# Patient Record
Sex: Female | Born: 1975
Health system: Southern US, Community
[De-identification: ages and names within clinical notes are randomized; demographics above are authoritative.]

## PROBLEM LIST (undated history)

## (undated) DIAGNOSIS — E669 Obesity, unspecified: Secondary | ICD-10-CM

## (undated) DIAGNOSIS — N3010 Interstitial cystitis (chronic) without hematuria: Secondary | ICD-10-CM

## (undated) DIAGNOSIS — K5909 Other constipation: Secondary | ICD-10-CM

## (undated) DIAGNOSIS — G4733 Obstructive sleep apnea (adult) (pediatric): Secondary | ICD-10-CM

## (undated) DIAGNOSIS — D649 Anemia, unspecified: Secondary | ICD-10-CM

## (undated) DIAGNOSIS — L409 Psoriasis, unspecified: Secondary | ICD-10-CM

## (undated) DIAGNOSIS — K449 Diaphragmatic hernia without obstruction or gangrene: Secondary | ICD-10-CM

## (undated) DIAGNOSIS — E039 Hypothyroidism, unspecified: Secondary | ICD-10-CM

## (undated) DIAGNOSIS — F22 Delusional disorders: Secondary | ICD-10-CM

## (undated) DIAGNOSIS — F329 Major depressive disorder, single episode, unspecified: Secondary | ICD-10-CM

## (undated) DIAGNOSIS — Z9989 Dependence on other enabling machines and devices: Secondary | ICD-10-CM

## (undated) DIAGNOSIS — F32A Depression, unspecified: Secondary | ICD-10-CM

## (undated) DIAGNOSIS — N3281 Overactive bladder: Secondary | ICD-10-CM

## (undated) DIAGNOSIS — K589 Irritable bowel syndrome without diarrhea: Secondary | ICD-10-CM

## (undated) DIAGNOSIS — K219 Gastro-esophageal reflux disease without esophagitis: Secondary | ICD-10-CM

## (undated) HISTORY — DX: Gastro-esophageal reflux disease without esophagitis: K21.9

## (undated) HISTORY — DX: Major depressive disorder, single episode, unspecified: F32.9

## (undated) HISTORY — DX: Hypothyroidism, unspecified: E03.9

## (undated) HISTORY — DX: Irritable bowel syndrome, unspecified: K58.9

## (undated) HISTORY — PX: TUBAL LIGATION: SHX77

## (undated) HISTORY — DX: Obstructive sleep apnea (adult) (pediatric): G47.33

## (undated) HISTORY — PX: BLADDER REPAIR: SHX76

## (undated) HISTORY — DX: Delusional disorders: F22

## (undated) HISTORY — DX: Overactive bladder: N32.81

## (undated) HISTORY — DX: Obesity, unspecified: E66.9

## (undated) HISTORY — DX: Interstitial cystitis (chronic) without hematuria: N30.10

## (undated) HISTORY — DX: Other constipation: K59.09

## (undated) HISTORY — DX: Depression, unspecified: F32.A

## (undated) HISTORY — DX: Diaphragmatic hernia without obstruction or gangrene: K44.9

## (undated) HISTORY — DX: Psoriasis, unspecified: L40.9

## (undated) HISTORY — DX: Obstructive sleep apnea (adult) (pediatric): Z99.89

## (undated) HISTORY — DX: Anemia, unspecified: D64.9

---

## 1997-04-23 HISTORY — PX: CHOLECYSTECTOMY: SHX55

## 2003-07-14 ENCOUNTER — Emergency Department (HOSPITAL_COMMUNITY): Admission: EM | Admit: 2003-07-14 | Discharge: 2003-07-14 | Payer: Self-pay | Admitting: Emergency Medicine

## 2004-04-23 HISTORY — PX: ESOPHAGOGASTRODUODENOSCOPY: SHX1529

## 2004-09-08 ENCOUNTER — Emergency Department (HOSPITAL_COMMUNITY): Admission: EM | Admit: 2004-09-08 | Discharge: 2004-09-08 | Payer: Self-pay | Admitting: Emergency Medicine

## 2004-10-20 ENCOUNTER — Ambulatory Visit: Payer: Self-pay | Admitting: Internal Medicine

## 2004-11-09 ENCOUNTER — Ambulatory Visit (HOSPITAL_COMMUNITY): Admission: RE | Admit: 2004-11-09 | Discharge: 2004-11-09 | Payer: Self-pay | Admitting: Internal Medicine

## 2004-11-09 ENCOUNTER — Ambulatory Visit: Payer: Self-pay | Admitting: Internal Medicine

## 2004-11-21 ENCOUNTER — Ambulatory Visit (HOSPITAL_COMMUNITY): Admission: RE | Admit: 2004-11-21 | Discharge: 2004-11-21 | Payer: Self-pay | Admitting: Internal Medicine

## 2004-12-27 ENCOUNTER — Ambulatory Visit: Payer: Self-pay | Admitting: Internal Medicine

## 2005-03-21 ENCOUNTER — Ambulatory Visit: Payer: Self-pay | Admitting: Internal Medicine

## 2005-10-04 ENCOUNTER — Ambulatory Visit: Payer: Self-pay | Admitting: Internal Medicine

## 2005-10-05 ENCOUNTER — Ambulatory Visit (HOSPITAL_COMMUNITY): Admission: RE | Admit: 2005-10-05 | Discharge: 2005-10-05 | Payer: Self-pay | Admitting: Internal Medicine

## 2005-10-17 ENCOUNTER — Ambulatory Visit: Payer: Self-pay | Admitting: Endocrinology

## 2005-11-13 ENCOUNTER — Ambulatory Visit: Payer: Self-pay | Admitting: Internal Medicine

## 2005-11-14 ENCOUNTER — Ambulatory Visit (HOSPITAL_COMMUNITY): Admission: RE | Admit: 2005-11-14 | Discharge: 2005-11-14 | Payer: Self-pay | Admitting: Internal Medicine

## 2007-01-20 ENCOUNTER — Encounter: Payer: Self-pay | Admitting: *Deleted

## 2007-01-20 DIAGNOSIS — K589 Irritable bowel syndrome without diarrhea: Secondary | ICD-10-CM | POA: Insufficient documentation

## 2007-01-20 DIAGNOSIS — F329 Major depressive disorder, single episode, unspecified: Secondary | ICD-10-CM | POA: Insufficient documentation

## 2007-01-20 DIAGNOSIS — J45909 Unspecified asthma, uncomplicated: Secondary | ICD-10-CM | POA: Insufficient documentation

## 2007-01-20 DIAGNOSIS — F22 Delusional disorders: Secondary | ICD-10-CM | POA: Insufficient documentation

## 2007-01-20 DIAGNOSIS — F32A Depression, unspecified: Secondary | ICD-10-CM | POA: Insufficient documentation

## 2007-01-20 DIAGNOSIS — J309 Allergic rhinitis, unspecified: Secondary | ICD-10-CM | POA: Insufficient documentation

## 2007-01-20 DIAGNOSIS — E039 Hypothyroidism, unspecified: Secondary | ICD-10-CM | POA: Insufficient documentation

## 2008-01-26 ENCOUNTER — Ambulatory Visit: Payer: Self-pay | Admitting: Internal Medicine

## 2008-08-23 ENCOUNTER — Encounter: Payer: Self-pay | Admitting: Gastroenterology

## 2008-09-15 DIAGNOSIS — F411 Generalized anxiety disorder: Secondary | ICD-10-CM | POA: Insufficient documentation

## 2008-09-15 DIAGNOSIS — K219 Gastro-esophageal reflux disease without esophagitis: Secondary | ICD-10-CM | POA: Insufficient documentation

## 2008-09-15 DIAGNOSIS — K449 Diaphragmatic hernia without obstruction or gangrene: Secondary | ICD-10-CM | POA: Insufficient documentation

## 2008-09-15 DIAGNOSIS — E7800 Pure hypercholesterolemia, unspecified: Secondary | ICD-10-CM | POA: Insufficient documentation

## 2008-09-15 DIAGNOSIS — K5900 Constipation, unspecified: Secondary | ICD-10-CM | POA: Insufficient documentation

## 2008-09-15 DIAGNOSIS — N318 Other neuromuscular dysfunction of bladder: Secondary | ICD-10-CM | POA: Insufficient documentation

## 2008-09-16 ENCOUNTER — Ambulatory Visit: Payer: Self-pay | Admitting: Internal Medicine

## 2008-09-16 DIAGNOSIS — R110 Nausea: Secondary | ICD-10-CM | POA: Insufficient documentation

## 2008-09-30 ENCOUNTER — Telehealth (INDEPENDENT_AMBULATORY_CARE_PROVIDER_SITE_OTHER): Payer: Self-pay

## 2008-10-11 ENCOUNTER — Encounter: Payer: Self-pay | Admitting: Internal Medicine

## 2009-02-07 ENCOUNTER — Encounter: Payer: Self-pay | Admitting: Urgent Care

## 2009-04-23 HISTORY — PX: COLONOSCOPY: SHX174

## 2009-05-05 ENCOUNTER — Telehealth (INDEPENDENT_AMBULATORY_CARE_PROVIDER_SITE_OTHER): Payer: Self-pay | Admitting: *Deleted

## 2009-05-05 ENCOUNTER — Ambulatory Visit: Payer: Self-pay | Admitting: Internal Medicine

## 2009-05-05 DIAGNOSIS — K625 Hemorrhage of anus and rectum: Secondary | ICD-10-CM | POA: Insufficient documentation

## 2009-05-05 DIAGNOSIS — K648 Other hemorrhoids: Secondary | ICD-10-CM | POA: Insufficient documentation

## 2009-05-19 ENCOUNTER — Encounter: Payer: Self-pay | Admitting: Urgent Care

## 2009-06-13 ENCOUNTER — Encounter (INDEPENDENT_AMBULATORY_CARE_PROVIDER_SITE_OTHER): Payer: Self-pay | Admitting: *Deleted

## 2009-06-14 ENCOUNTER — Telehealth (INDEPENDENT_AMBULATORY_CARE_PROVIDER_SITE_OTHER): Payer: Self-pay | Admitting: *Deleted

## 2009-06-16 ENCOUNTER — Encounter: Payer: Self-pay | Admitting: Internal Medicine

## 2009-07-21 ENCOUNTER — Ambulatory Visit (HOSPITAL_COMMUNITY): Admission: RE | Admit: 2009-07-21 | Discharge: 2009-07-21 | Payer: Self-pay | Admitting: Internal Medicine

## 2009-07-21 ENCOUNTER — Ambulatory Visit: Payer: Self-pay | Admitting: Internal Medicine

## 2009-07-22 ENCOUNTER — Encounter: Payer: Self-pay | Admitting: Urgent Care

## 2009-10-10 ENCOUNTER — Encounter: Payer: Self-pay | Admitting: Urgent Care

## 2009-10-20 ENCOUNTER — Encounter: Payer: Self-pay | Admitting: Gastroenterology

## 2009-12-07 ENCOUNTER — Encounter: Payer: Self-pay | Admitting: Gastroenterology

## 2010-02-13 ENCOUNTER — Encounter: Payer: Self-pay | Admitting: Urgent Care

## 2010-04-18 ENCOUNTER — Encounter: Payer: Self-pay | Admitting: Gastroenterology

## 2010-05-08 ENCOUNTER — Ambulatory Visit
Admission: RE | Admit: 2010-05-08 | Discharge: 2010-05-08 | Payer: Self-pay | Source: Home / Self Care | Attending: Gastroenterology | Admitting: Gastroenterology

## 2010-05-23 NOTE — Medication Information (Signed)
Summary: Tax adviser   Imported By: Diana Eves 05/19/2009 08:43:49  _____________________________________________________________________  External Attachment:    Type:   Image     Comment:   External Document  Appended Document: RX Folder Please call pharmacy.  She was supposed to get 1800 cc, not 180 on 1/13 rx.  Appended Document: RX [facility] and spoke with Dorene Sorrow, pharmacist, and Clydie Braun, tech, in reference to pt's order for  Lactulose on 05/05/2009 was for 1800 cc's not 180 cc's. Pt has been paying a co-pay of $1.10 every time she got 180 cc's. Clydie Braun said they will reimburse pt for $4.40 and give her the full RX. I called pt and informed her, and she said she was wondering why she was having to go so often to the pharmacy.

## 2010-05-23 NOTE — Medication Information (Signed)
Summary: LACTULOSE  LACTULOSE   Imported By: Rexene Alberts 12/07/2009 09:33:55  _____________________________________________________________________  External Attachment:    Type:   Image     Comment:   External Document  Appended Document: LACTULOSE    Prescriptions: LACTULOSE 10 GM/15ML SOLN (LACTULOSE) 15-30cc by mouth two times a day for constipation  #1800cc x 5   Entered and Authorized by:   Leanna Battles. Dixon Boos   Signed by:   Leanna Battles Dixon Boos on 12/07/2009   Method used:   Electronically to        [facility]* (retail)       [address]       [place], [place]  [address]       Ph: [phone]       Fax: [phone]   RxID:   [id number]

## 2010-05-23 NOTE — Medication Information (Signed)
Summary: Tax adviser   Imported By: Diana Eves 07/22/2009 15:19:05  _____________________________________________________________________  External Attachment:    Type:   Image     Comment:   External Document  Appended Document: RX FolderOMEPRAZOLE    Prescriptions: OMEPRAZOLE 40 MG CPDR (OMEPRAZOLE) one by mouth 30 mins before breakfast and at bedtime  #60 x 2   Entered and Authorized by:   Joselyn Arrow FNP-BC   Signed by:   Joselyn Arrow FNP-BC on 07/25/2009   Method used:   Electronically to        [facility] (retail)       [address]       [place], [place]  [address]       Ph: [phone]       Fax: [phone]   RxID:   [id number]

## 2010-05-23 NOTE — Assessment & Plan Note (Signed)
Summary: YR FY/CHRONIC GERD AND NAUSEA,HEMORRHOIDS/SS   Visit Type:  f/u Primary Care Provider:  Selinda Flavin, MD  Chief Complaint:  1 year follow up- having constipation and bleeding and hemorroids.  History of Present Illness: 35 y/o female with chronic constipation, chronic nausea, gerd.  She stopped the glycolax due to bladder irritation.  Currently undergoing bladder stimulation for ?overactive bladder or interstitial cystitis.  Takes docusate sodium 3-4 each night.  BM every 3-4 days and then has multiple stools that day.  Intermittent brbpr and hemorrhoid irritation every two weeks.  No prior TCS.  Wants to have hemorrhoid surgery.  Trying OTC hemorrhoid creams without relief.  Has intermittent nausea with spicey or greasy foods.  Heartburn okay on omeprazole 40mg  by mouth two times a day.  Wt up 15 pounds since 5/09 (25 in last one year).  States she avoids caffeine, eats high fiber diet, excercises 3/week, consumes 8 glasses of fluid daily.    Current Medications (verified): 1)  Lorazepam 0.5 Mg  Tabs (Lorazepam) .... Take 1 Tablet By Mouth Two Times A Day 2)  Vytorin 10-20 Mg  Tabs (Ezetimibe-Simvastatin) .... Take 1po Qd 3)  Levoxyl 75 Mcg Tabs (Levothyroxine Sodium) .... Take 1 Tablet By Mouth Once A Day 4)  Multivitamins   Tabs (Multiple Vitamin) .... Take 1 By Mouth Qd 5)  Promethazine Hcl 25 Mg  Tabs (Promethazine Hcl) .... Take 1 By Mouth Once Daily Prn 6)  Omeprazole 40 Mg Cpdr (Omeprazole) .... One By Mouth 30 Mins Before Breakfast and At Bedtime 7)  Fiber Choice .... As Directed 8)  Risperidone 1 Mg Tabs (Risperidone) .... At Bedtime 9)  Vesicare 10 Mg Tabs (Solifenacin Succinate) .... Once Daily 10)  Albuterol Inhaler .... As Directed 11)  Colace 100 Mg Caps (Docusate Sodium) .... Once Daily 12)  Mometasone Furoate 0.1 % Soln (Mometasone Furoate) .... Once Daily  Allergies (verified): No Known Drug Allergies  Past History:  Past Surgical History: Last updated:  09/15/2008 CHOLECYSTECTOMY  1999  Past Medical History: Allergic rhinitis Asthma Depression Hypothyroidism Psoriasis Overactive bladder/?interstitial cystitis GERD EGD 7/06, patulous EG junction, small hiatal hernia. Normal gastric empyting study, 2006 Normal SBFT, 2007 Paranoia Chronic constipation No prior colonoscopy  Family History: Mother, h/o PUD, treated for breast cancer Father, deceased age 8, DM No FH CRC or liver dz.  Social History: Divorced. 2 children. Unemployed. Never been smoker. No alcohol use.  Review of Systems General:  Denies fever, chills, sweats, anorexia, fatigue, weakness, and weight loss. Eyes:  Denies vision loss. ENT:  Denies nasal congestion and difficulty swallowing. CV:  Denies chest pains, angina, palpitations, syncope, and peripheral edema. Resp:  Denies dyspnea at rest, dyspnea with exercise, and cough. GI:  See HPI. GU:  Complains of urinary frequency; denies urinary burning and blood in urine. MS:  Denies joint pain / LOM. Derm:  Denies rash and itching. Neuro:  Denies weakness, paralysis, and frequent headaches. Psych:  Denies depression, anxiety, and suicidal ideation. Endo:  Denies unusual weight change. Heme:  Denies bruising and bleeding. Allergy:  Denies hives and rash.  Vital Signs:  Patient profile:   35 year old female Height:      66 inches Weight:      280 pounds BMI:     45.36 Temp:     98.2 degrees F oral Pulse rate:   60 / minute BP sitting:   110 / 72  (left arm) Cuff size:   large  Vitals Entered By: Hendricks Limes  LPN (May 05, 2009 11:33 AM)  Physical Exam  General:  Well developed, well nourished, no acute distress.obese.   Head:  Normocephalic and atraumatic. Eyes:  Conjunctivae pink, no scleral icterus.  Mouth:  Oropharyngeal mucosa moist, pink.  No lesions, erythema or exudate.    Neck:  Supple; no masses or thyromegaly. Lungs:  Clear throughout to auscultation. Heart:  Regular rate and rhythm;  no murmurs, rubs,  or bruits. Abdomen:  normal bowel sounds, obese, and suprapubic tenderness  normal bowel sounds, obese, suprapubic tendernessRUQ tenderness, without guarding, without rebound, no hernia, no masses, and no hepatomegally or splenomegaly.   Rectal:  No ext hemorrhoids.  Soft brown stool, heme negative. No masses in rectal vault.  perirectal tenderness.   Extremities:  No clubbing, cyanosis, edema or deformities noted. Neurologic:  Alert and  oriented x4;  grossly normal neurologically. Skin:  Intact without significant lesions or rashes. Cervical Nodes:  No significant cervical adenopathy. Psych:  depressed affect.    Impression & Recommendations:  Problem # 1:  CONSTIPATION, CHRONIC (ICD-564.09)  Chronic constipation with intermittent anorectal irritation and brbpr.  Suspect internal hemorrhoids versus anorectal fissure.  Needs colonoscopy to r/o polyps, malignancy, etc.  Patient wants to schedule after she completes bladder stimulation procedures.  She will call to schedule when she is ready.  She c/o inadequate sedation at time of EGD in 2006.  Due to this and polypharmacy, will arrange for sedation via MAC. In meantime, she will stop docusate sodium.  Add Lactulose, probiotic.  2 week course of hemorrhoidal cream.  High fiber diet and increase excercise to 5/week.   Orders: Est. Patient Level III ([address])  Problem # 2:  GERD (ICD-530.81)  Stable.  Continue omeprazole 40mg  by mouth two times a day for now. Encouraged weight loss and antireflux measures.  Orders: Est. Patient Level III (47425)  Patient Instructions: 1)  Stop Colace. 2)  Start lactulose, see RX. 3)  Start Dig Advantage, constipation formula, one daily for four weeks. 4)  Call to schedule colonoscopy as soon as you are ready. 5)  High Fiber, Low Fat  Healthy Eating Plan brochure given.  6)  The medication list was reviewed and reconciled.  All changed / newly prescribed medications were explained.  A  complete medication list was provided to the patient / caregiver. Prescriptions: HYDROCORTISONE 2.5 % CREA (HYDROCORTISONE) apply anorectally two times a day for two weeks  #2 weeks x 0   Entered and Authorized by:   Leanna Battles. Dixon Boos   Signed by:   Leanna Battles Dixon Boos on 05/05/2009   Method used:   Electronically to        [facility] (retail)       [address]       [place], [place]  [address]       Ph: [phone]       Fax: [phone]   RxID:   [id number] OMEPRAZOLE 40 MG CPDR (OMEPRAZOLE) one by mouth 30 mins before breakfast and at bedtime  #60 x 5   Entered and Authorized by:   Leanna Battles. Dixon Boos   Signed by:   Leanna Battles Dixon Boos on 05/05/2009   Method used:   Electronically to        [facility] (retail)       [address]       [place], [place]  [address]  Ph: [phone]       Fax: [phone]   RxID:   [id number] PROMETHAZINE HCL 25 MG  TABS (PROMETHAZINE HCL) take 1 by mouth once daily prn  #20 x 0   Entered and Authorized by:   Leanna Battles. Dixon Boos   Signed by:   Leanna Battles Dixon Boos on 05/05/2009   Method used:   Electronically to        [facility] (retail)       [address]       [place], [place]  [address]       Ph: [phone]       Fax: [phone]   RxID:   [id number] LACTULOSE 10 GM/15ML SOLN (LACTULOSE) 15-30cc by mouth two times a day for constipation  #1800cc x 3   Entered and Authorized by:   Leanna Battles. Dixon Boos   Signed by:   Leanna Battles Dionte Blaustein PA-C on 05/05/2009   Method used:   Print then Give to Patient   RxID:   [id number]   Appended Document: Orders Updatehemocult    Clinical Lists Changes  Orders: Added new Service order of Hemoccult Guaiac-1 spec.(in office) [phone]) - Signed

## 2010-05-23 NOTE — Medication Information (Signed)
Summary: PROMETHAZINE 25MG   PROMETHAZINE 25MG    Imported By: Rexene Alberts 02/13/2010 08:15:39  _____________________________________________________________________  External Attachment:    Type:   Image     Comment:   External Document  Appended Document: PROMETHAZINE 25MG  Denied. Pt needs OV to discuss.  Appended Document: PROMETHAZINE 25MG  pharmacy informed

## 2010-05-23 NOTE — Medication Information (Signed)
Summary: Tax adviser   Imported By: Diana Eves 10/10/2009 09:20:05  _____________________________________________________________________  External Attachment:    Type:   Image     Comment:   External Document  Appended Document: RX FolderPRILOSEC    Prescriptions: OMEPRAZOLE 40 MG CPDR (OMEPRAZOLE) one by mouth 30 mins before breakfast and at bedtime  #60 x 5   Entered and Authorized by:   Joselyn Arrow FNP-BC   Signed by:   Joselyn Arrow FNP-BC on 10/10/2009   Method used:   Electronically to        [facility] (retail)       [address]       [place], [place]  [address]       Ph: [phone]       Fax: [phone]   RxID:   [id number]

## 2010-05-23 NOTE — Letter (Signed)
Summary: Recall Radiology  [facility]  [address]   [place], [place] [address]   Phone: [phone]  Fax: [phone]    June 13, 2009  LINN CLAVIN [address] [place], [place]  [address] [birth year]   Dear Ms. MULLIGAN,   Our office needs to get you scheduled for your Colonoscopy. Please give our office a call to schedule this.  You may call the office at your convenience at [phone].  Please ask for the Referral Coordinator to make arrangements for this to be scheduled.  You may have to leave a message on our voice mail.  We will return your call.  If for any reason you do not wish to schedule this, please advise the office.  Please do not neglect your health.   Thank you,    Ave Filter  [facility] Ph: [phone]   Fax: [phone]

## 2010-05-23 NOTE — Progress Notes (Signed)
Summary: Shirley Perry  Phone Note Call from Patient   Summary of Call: Pt had called and LMOM. She was calling regarding the letter she received. Her number is [phone] Initial call taken by: Diana Eves,  June 14, 2009 3:49 PM     Appended Document: Shirley Perry I spoke with pt TCS scheduled for 07/21/09@7 :30am

## 2010-05-23 NOTE — Medication Information (Signed)
Summary: Tax adviser   Imported By: Diana Eves 05/19/2009 16:25:18  _____________________________________________________________________  External Attachment:    Type:   Image     Comment:   External Document  Appended Document: RX Folder Already addressed.  See previous note.

## 2010-05-23 NOTE — Medication Information (Signed)
Summary: RX Folder  RX Folder   Imported By: Peggyann Shoals 10/20/2009 14:15:38  _____________________________________________________________________  External Attachment:    Type:   Image     Comment:   External Document  Appended Document: RX Folder-lactulose    Prescriptions: LACTULOSE 10 GM/15ML SOLN (LACTULOSE) 15-30cc by mouth two times a day for constipation  #1800cc x 5   Entered and Authorized by:   Leanna Battles. Dixon Boos   Signed by:   Leanna Battles Dixon Boos on 10/20/2009   Method used:   Electronically to        [facility] (retail)       [address]       [place], [place]  [address]       Ph: [phone]       Fax: [phone]   RxID:   [id number]

## 2010-05-23 NOTE — Progress Notes (Signed)
Summary: TCS in the OR  ---- Converted from flag ---- ---- 05/05/2009 1:49 PM, Shirley Perry. Shirley Perry wrote: Please note, when patient calls to schedule TCS, it needs to be done in OR.  Also, if outside of 30 days from now but less than 90, we can triage her. ------------------------------

## 2010-05-23 NOTE — Letter (Signed)
Summary: TRIAGE ORDER  TRIAGE ORDER   Imported By: Ave Filter 06/16/2009 13:57:00  _____________________________________________________________________  External Attachment:    Type:   Image     Comment:   External Document

## 2010-05-25 NOTE — Medication Information (Signed)
Summary: OMEPRAZOLE 40MG   OMEPRAZOLE 40MG    Imported By: Rexene Alberts 04/18/2010 09:53:33  _____________________________________________________________________  External Attachment:    Type:   Image     Comment:   External Document  Appended Document: OMEPRAZOLE 40MG     Prescriptions: OMEPRAZOLE 40 MG CPDR (OMEPRAZOLE) one by mouth 30 mins before breakfast and at bedtime  #60 x 5   Entered and Authorized by:   Gerrit Halls NP   Signed by:   Gerrit Halls NP on 04/18/2010   Method used:   Faxed to ...       [facility]* (retail)       [address]       [place], [place]  [address]       Ph: [phone]       Fax: [phone]   RxID:   [id number]

## 2010-05-25 NOTE — Assessment & Plan Note (Signed)
Summary: PT NEEDS OV PRIOR TO REFILLS ON PHENEGRAN/CM   Visit Type:  Follow-up Visit Primary Care Provider:  Selinda Flavin, MD  CC:  OV for refills on phenergan.  History of Present Illness: [name] is a 35 year old female with a hx of chronic nausea, GERD, constipation. She reports today doing fairly well. c/o intermittent nausea, on average 3-6X per month. States "lasts all day". Feels may be attributed to fried and spicy foods. Stress worsens nausea.  Denies abdominal pain. Denies constipation. Has 2-3 soft BMs daily, no blood noted. on omeprazole twice/day, which seems to be the best PPI for her. She has tried multiple agents in the past. Wt remains at 41, which is stable from last January.  Wt 2007: 248 Wt 2009: 255 Wt 2010: 266 Wt 2011: 280  TCS for constipation: 07/21/09 IMPRESSION:  Single anal papilla, otherwise normal rectum, colon, terminal ileum.  Current Medications (verified): 1)  Vytorin 10-20 Mg  Tabs (Ezetimibe-Simvastatin) .... Take 1po Qd 2)  Levoxyl 75 Mcg Tabs (Levothyroxine Sodium) .... Take 1 Tablet By Mouth Once A Day 3)  Multivitamins   Tabs (Multiple Vitamin) .... Take 1 By Mouth Qd 4)  Promethazine Hcl 25 Mg  Tabs (Promethazine Hcl) .... Take 1 By Mouth Once Daily Prn 5)  Omeprazole 40 Mg Cpdr (Omeprazole) .... One By Mouth 30 Mins Before Breakfast and At Bedtime 6)  Vesicare 10 Mg Tabs (Solifenacin Succinate) .... Once Daily 7)  Albuterol Inhaler .... As Directed 8)  Mometasone Furoate 0.1 % Soln (Mometasone Furoate) .... Once Daily 9)  Lactulose 10 Gm/5ml Soln (Lactulose) .... 30cc Once Daily and As Needed 10)  Lorazepam 1 Mg Tabs (Lorazepam) .... One Tablet Three Tmes Daily As Needed 11)  Elmiron 100 Mg Caps (Pentosan Polysulfate Sodium) .... One Tablet Three Times Daily 12)  Latuda 40 Mg Tabs (Lurasidone Hcl) 13)  Latuda 40 Mg Tabs (Lurasidone Hcl) .... One Tablet Daily 14)  Vitamin D .... One Tablet Daily 15)  Childrens Multi-Vitamin .... One  Tablet Daily  Allergies (verified): No Known Drug Allergies  Past History:  Past Surgical History: Last updated: 09/15/2008 CHOLECYSTECTOMY  1999  Past Medical History: Allergic rhinitis Asthma Depression Hypothyroidism Psoriasis Overactive bladder/?interstitial cystitis GERD EGD 7/06, patulous EG junction, small hiatal hernia. Normal gastric empyting study, 2006 Normal SBFT, 2007 Paranoia Chronic constipation  07/21/09 TCS: IMPRESSION:  Single anal papilla, otherwise normal rectum, colon, terminal ileum.   Family History: Reviewed history from 05/05/2009 and no changes required. Mother, h/o PUD, treated for breast cancer Father, deceased age 84, DM No FH CRC or liver dz.  Social History: Reviewed history from 05/05/2009 and no changes required. Divorced. 2 children 17 and 12. Unemployed. Never been smoker. No alcohol use.   Review of Systems General:  Denies fever, chills, and anorexia. Eyes:  Denies blurring, irritation, and discharge. ENT:  Denies sore throat, hoarseness, and difficulty swallowing. CV:  Denies chest pains and syncope. Resp:  Denies dyspnea at rest and wheezing. GI:  Complains of nausea; denies indigestion/heartburn, abdominal pain, diarrhea, constipation, change in bowel habits, bloody BM's, and black BMs. GU:  Denies urinary burning and urinary frequency. MS:  Denies joint pain / LOM, joint swelling, and joint stiffness. Derm:  Denies rash, itching, and dry skin. Neuro:  Denies weakness and syncope. Psych:  Denies depression and anxiety. Endo:  Denies cold intolerance and heat intolerance. Heme:  Denies bruising and bleeding.  Vital Signs:  Patient profile:   35 year old female Height:  66 inches Weight:      280.50 pounds BMI:     45.44 Temp:     98.3 degrees F oral Pulse rate:   80 / minute BP sitting:   118 / 72  (left arm) Cuff size:   large  Vitals Entered By: Cloria Spring LPN (May 08, 2010 2:04 PM)  Physical  Exam  General:  Well developed, well nourished, no acute distress.obese.   Head:  Normocephalic and atraumatic. Eyes:  sclera without icterus Mouth:  No deformity or lesions, dentition normal. Lungs:  Clear throughout to auscultation. Heart:  Regular rate and rhythm; no murmurs, rubs,  or bruits. Abdomen:  obese, soft, +BS, non-tender, non-distended. No HSM, no rebound or guarding noted.  Msk:  Symmetrical with no gross deformities. Normal posture. Pulses:  Normal pulses noted. Extremities:  No clubbing, cyanosis, edema or deformities noted. Neurologic:  Alert and  oriented x4;  grossly normal neurologically. Skin:  Intact without significant lesions or rashes. Psych:  Alert and cooperative. Normal mood and affect.  Impression & Recommendations:  Problem # 1:  NAUSEA (ICD-43.27)  35 year old Caucasian female with chronic nausea, has undergone thorough work-up and evaluation in the past. Actually seems to be doing fairly well at this time with only 3-6 episodes of nausea per month, which seems to be linked to fried and fatty foods. GERD well controlled currently on twice daily prilosec. . Stress definitely exacerbated her nausea. She does remain at a stable weight from last January, but she has steadily gained weight over the past few years we have been seeing her. Discussed in detail importance of diet and exercise for overall health as well as reflux symptoms.   Continue Prilosec twice per day Phenergan as needed, hopefully this can be weaned off by the next follow-up as pt avoids triggers such as foods/anxiety, etc weight loss goal of 10lbs by next visit follow-up in 6 mos  Orders: Est. Patient Level II ([address])  Problem # 2:  CONSTIPATION, CHRONIC (ICD-564.09)  hx of chronic constipation, denies constipation currently. Lactulose working well. No melena, hematochezia, continue current regimen.   Orders: Est. Patient Level II ([address]) Prescriptions: LACTULOSE 10 GM/15ML SOLN  (LACTULOSE) take 30 ml by mouth daily for constipation  #1 month x 5   Entered and Authorized by:   Gerrit Halls NP   Signed by:   Gerrit Halls NP on 05/08/2010   Method used:   Faxed to ...       [facility] (retail)       [address]       [place], [place]  [address]       Ph: [phone]       Fax: [phone]   RxID:   [id number] PROMETHAZINE HCL 25 MG TABS (PROMETHAZINE HCL) take 1 by mouth daily as needed nausea  #30 x 1   Entered and Authorized by:   Gerrit Halls NP   Signed by:   Gerrit Halls NP on 05/08/2010   Method used:   Faxed to ...       [facility] (retail)       [address]       [place], [place]  [address]       Ph: [phone]       Fax: [phone]   RxID:   [id number]   Appended Document: PT NEEDS OV PRIOR TO REFILLS ON PHENEGRAN/CM 6  MONTH F/U OPV IS IN THE COMPUTER

## 2010-06-27 ENCOUNTER — Encounter: Payer: Self-pay | Admitting: Gastroenterology

## 2010-07-05 ENCOUNTER — Encounter: Payer: Self-pay | Admitting: Internal Medicine

## 2010-07-05 ENCOUNTER — Encounter: Payer: Self-pay | Admitting: Gastroenterology

## 2010-07-05 ENCOUNTER — Other Ambulatory Visit: Payer: Self-pay | Admitting: Internal Medicine

## 2010-07-05 ENCOUNTER — Ambulatory Visit (INDEPENDENT_AMBULATORY_CARE_PROVIDER_SITE_OTHER): Payer: Medicare Other | Admitting: Gastroenterology

## 2010-07-05 DIAGNOSIS — R110 Nausea: Secondary | ICD-10-CM

## 2010-07-11 NOTE — Letter (Signed)
Summary: GASTRIC EMPTYING STUDY ORDER  GASTRIC EMPTYING STUDY ORDER   Imported By: Ave Filter 07/05/2010 15:45:42  _____________________________________________________________________  External Attachment:    Type:   Image     Comment:   External Document

## 2010-07-11 NOTE — Assessment & Plan Note (Signed)
Summary: fu ov in 6 months, acid reflux/pt not due until July but want...   Vital Signs:  Patient profile:   35 year old female Height:      65 inches Weight:      287 pounds BMI:     47.93 Temp:     98.9 degrees F oral Pulse rate:   92 / minute BP sitting:   120 / 72  (left arm)  Vitals Entered By: Carolan Clines LPN (July 05, 2010 2:39 PM)  Visit Type:  Follow-up Visit Primary Care Provider:  Selinda Flavin, MD   History of Present Illness: [name] presents today in f/u. Chronic nausea. Last GES in 2006. Denies abdominal pain. Continues to gain weight. Up actually 7 lbs from visit in Jan. She has continued to steadily gain weight over the past few years. Feels like reflux is controlled fairly well on two times a day prilosec. She has tried others in past, but this seems to work best for her. She is on several medications for management of bipolar disorder, with side effects of possible nausea. Difficult to tell if this is what is causing this chronic nausea. She will be seeing Dr. Raliegh Scarlet next month. We will try to get a sooner appt for her.  Denies dysphagia/odynophagia. No difficulty with constipation currently.   Current Medications (verified): 1)  Vytorin 10-20 Mg  Tabs (Ezetimibe-Simvastatin) .... Take 1po Qd 2)  Levoxyl 75 Mcg Tabs (Levothyroxine Sodium) .... Take 1 Tablet By Mouth Once A Day 3)  Multivitamins   Tabs (Multiple Vitamin) .... Take 1 By Mouth Qd 4)  Promethazine Hcl 25 Mg  Tabs (Promethazine Hcl) .... Take 1 By Mouth Once Daily Prn 5)  Omeprazole 40 Mg Cpdr (Omeprazole) .... One By Mouth 30 Mins Before Breakfast and At Bedtime 6)  Vesicare 10 Mg Tabs (Solifenacin Succinate) .... Once Daily 7)  Albuterol Inhaler .... As Directed 8)  Mometasone Furoate 0.1 % Soln (Mometasone Furoate) .... Once Daily 9)  Lactulose 10 Gm/48ml Soln (Lactulose) .... 30cc Once Daily and As Needed 10)  Lorazepam 1 Mg Tabs (Lorazepam) .... One Tablet Three Tmes Daily As Needed 11)   Elmiron 100 Mg Caps (Pentosan Polysulfate Sodium) .... One Tablet Three Times Daily 12)  Latuda 40 Mg Tabs (Lurasidone Hcl) .... Take One Once Daily  Allergies (verified): No Known Drug Allergies  Past History:  Past Medical History: Last updated: 05/08/2010 Allergic rhinitis Asthma Depression Hypothyroidism Psoriasis Overactive bladder/?interstitial cystitis GERD EGD 7/06, patulous EG junction, small hiatal hernia. Normal gastric empyting study, 2006 Normal SBFT, 2007 Paranoia Chronic constipation  07/21/09 TCS: IMPRESSION:  Single anal papilla, otherwise normal rectum, colon, terminal ileum.  Review of Systems General:  Denies fever, chills, and anorexia. Eyes:  Denies blurring, irritation, and discharge. ENT:  Denies sore throat, hoarseness, and difficulty swallowing. CV:  Denies chest pains and syncope. Resp:  Denies dyspnea at rest and wheezing. GI:  See HPI. GU:  Denies urinary burning and urinary frequency. MS:  Denies joint pain / LOM, joint swelling, and joint stiffness. Derm:  Denies rash, itching, and dry skin. Neuro:  Denies weakness and syncope. Psych:  Denies depression and anxiety. Endo:  Denies cold intolerance and heat intolerance.  Physical Exam  General:  Well developed, well nourished, no acute distress.obese.   Lungs:  Clear throughout to auscultation. Heart:  Regular rate and rhythm; no murmurs, rubs,  or bruits. Abdomen:  obese, +BS, soft, non-tender, non-distended. no HSM noted. no rebound or guarding.  Msk:  Symmetrical with no gross deformities. Normal posture. Neurologic:  Alert and  oriented x4;  grossly normal neurologically. Skin:  Intact without significant lesions or rashes. Psych:  Alert and cooperative. Normal mood and affect.   Impression & Recommendations:  Problem # 1:  NAUSEA (ICD-31.51)  35 year old Caucasian female with hx significant for bipolar disorder, chronic nausea. She has been taking phenergan in past and is now  requesting something that has less side effects of drowsiness. It should be noted, that although she continues to complain of mainly morning nausea, she has steadily gained weight over the past few years; she has gained an additional 7 lbs since Jan 2012. Last GES was done in 2006. Although it is a low likelihood of gastroparesis, we should revisit this and possibly proceed with an upper endoscopy to assess for gastritis or other factors. Pt may very well have nausea r/t medication regimen for bipolar disorder. We will attempt to have her see Dr. Raliegh Scarlet sooner.  GES Continue prilosec twice/day Sooner appt with Dr. Raliegh Scarlet for medication adjustement (possibly) Consider EGD if nausea continues Zofran instead of phenergan WEIGHT LOSS. Discussed avoidance of fatty/fried foods, exercise. F/U in 6 weeks.   Orders: Est. Patient Level II (29562) Prescriptions: LACTULOSE 10 GM/15ML SOLN (LACTULOSE) 30cc once daily and as needed  #964ml x 3   Entered and Authorized by:   Gerrit Halls NP   Signed by:   Gerrit Halls NP on 07/05/2010   Method used:   Faxed to ...       [facility]* (retail)       [address]       [place], [place]  [address]       Ph: [phone]       Fax: [phone]   RxID:   [id number] ZOFRAN 4 MG TABS (ONDANSETRON HCL) take 1 by mouth every 6 hours as needed nausea  #120 x 1   Entered and Authorized by:   Gerrit Halls NP   Signed by:   Gerrit Halls NP on 07/05/2010   Method used:   Faxed to ...       [facility]* (retail)       [address]       [place], [place]  [address]       Ph: [phone]       Fax: [phone]   RxID:   [id number]    Orders Added: 1)  Est. Patient Level II [02542]  Appended Document: fu ov in 6 months, acid reflux/pt not due until July but want... 6 WK F/U OPV IS IN THE COMPUTER

## 2010-07-14 ENCOUNTER — Encounter (HOSPITAL_COMMUNITY): Payer: Medicare Other

## 2010-07-16 LAB — BASIC METABOLIC PANEL
BUN: 8 mg/dL (ref 6–23)
Calcium: 9.3 mg/dL (ref 8.4–10.5)
Chloride: 102 mEq/L (ref 96–112)
Glucose, Bld: 89 mg/dL (ref 70–99)
Potassium: 4.2 mEq/L (ref 3.5–5.1)
Sodium: 136 mEq/L (ref 135–145)

## 2010-07-17 ENCOUNTER — Encounter: Payer: Self-pay | Admitting: Gastroenterology

## 2010-08-04 ENCOUNTER — Encounter (HOSPITAL_COMMUNITY)
Admission: RE | Admit: 2010-08-04 | Discharge: 2010-08-04 | Disposition: A | Discharge status: Home / Self Care | Payer: Medicare Other | Source: Ambulatory Visit | Attending: Internal Medicine | Admitting: Internal Medicine

## 2010-08-04 DIAGNOSIS — R110 Nausea: Secondary | ICD-10-CM | POA: Insufficient documentation

## 2010-08-04 MED ORDER — TECHNETIUM TC 99M SULFUR COLLOID
2.0000 | Freq: Once | INTRAVENOUS | Status: AC | PRN
  Administered 2010-08-04: 2.2 via INTRAVENOUS

## 2010-08-11 ENCOUNTER — Encounter: Payer: Self-pay | Admitting: Gastroenterology

## 2010-08-11 ENCOUNTER — Ambulatory Visit (INDEPENDENT_AMBULATORY_CARE_PROVIDER_SITE_OTHER): Payer: Medicare Other | Admitting: Gastroenterology

## 2010-08-11 VITALS — BP 117/69 | HR 84 | Temp 98.5°F | Ht 66.0 in | Wt 267.2 lb

## 2010-08-11 DIAGNOSIS — K5909 Other constipation: Secondary | ICD-10-CM

## 2010-08-11 DIAGNOSIS — K219 Gastro-esophageal reflux disease without esophagitis: Secondary | ICD-10-CM

## 2010-08-11 DIAGNOSIS — R110 Nausea: Secondary | ICD-10-CM

## 2010-08-11 DIAGNOSIS — R634 Abnormal weight loss: Secondary | ICD-10-CM

## 2010-08-11 NOTE — Assessment & Plan Note (Signed)
Recent intentional weight loss. Congratulated her on her efforts. Reports up to date on TSH.

## 2010-08-11 NOTE — Assessment & Plan Note (Signed)
Chronic constipation. She may require multiple laxatives to get good results. Currently on moderate dose of lactulose. May increase to 3 times a day for a few days but if she requires persistent increase in dose then would advise her to add back MiraLax along with her lactulose. We did discuss a trial of Amitiza but given her chronic nausea will avoid it for now.

## 2010-08-11 NOTE — Assessment & Plan Note (Addendum)
Chronic nausea without vomiting. GERD well controlled. Continues to have intermittent constipation. She's not feel her symptoms are related to the lactulose. As previously discussed, arrange for EGD for further evaluation of chronic nausea/GERD. EGD to be done with deep sedation given her history of polypharmacy and inadequate conscious sedation. I have discussed the risks, alternatives, benefits with regards to but not limited to the risk of reaction to medication, bleeding, infection, perforation and the patient is agreeable to proceed. Written consent to be obtained.  Would minimize Zofran and Phenergan as much as possible.  We did discuss that if her EGD is unremarkable, we may need to consider looking for non-GI related source of her nausea such as head CT, medication side effect, depression as a cause. Of note she has never had CT imaging of her abdomen

## 2010-08-11 NOTE — Progress Notes (Signed)
Cc to PCP 

## 2010-08-11 NOTE — Progress Notes (Signed)
Primary Care Physician:  Criss Rosales, MD  Primary Gastroenterologist:  Dr. Roetta Sessions  Chief Complaint  Patient presents with  . Constipation  . Nausea    HPI:  Shirley Perry is a 35 y.o. female here for six-week followup for persistent nausea. Since her last office visit she had a gastric emptying study which was normal. Her nausea is been going on for a long time. Up until recently she has had a steady weight gain over last couple years. I believe her last weight was inaccurate at 287 pounds because today she weighs 267 pounds. She states she has not lost 20 pounds in the last 6 weeks but has lost about 10 pounds on a new low-carb diet. She wakes up the morning with nausea. She has no vomiting. States her reflux is well-controlled. Denies dysphagia. No significant abdominal pain. Bowel movements continue to be difficult for her. She has chronic constipation. Currently on lactulose as she states MiraLax stopped working. She's taking 2 tablespoons of MiraLax twice a day, some days has good results but other days none. Recently increased constipation due to Zofran. No recent rectal bleeding. No melena. No recent change in medications.  Current Outpatient Prescriptions  Medication Sig Dispense Refill  . albuterol (PROVENTIL,VENTOLIN) 90 MCG/ACT inhaler Inhale 2 puffs into the lungs every 6 (six) hours as needed.        . Cinnamon 500 MG capsule Take 2,000 mg by mouth daily.        Marland Kitchen ezetimibe-simvastatin (VYTORIN) 10-20 MG per tablet Take 1 tablet by mouth daily.        . Lactobacillus (ACIDOPHILUS) 100 MG CAPS Take 1 capsule by mouth as needed.        . lactulose (CHRONULAC) 10 GM/15ML solution Take 20 g by mouth daily as needed.        Marland Kitchen levothyroxine (SYNTHROID, LEVOTHROID) 75 MCG tablet Take 75 mcg by mouth daily.        Marland Kitchen LORazepam (ATIVAN) 1 MG tablet Take 1 mg by mouth 3 (three) times daily as needed.        . Lurasidone HCl (LATUDA) 40 MG TABS Take by mouth daily.        .  mometasone (ELOCON) 0.1 % cream Apply 1 application topically daily.        Marland Kitchen omeprazole (PRILOSEC) 40 MG capsule Take 40 mg by mouth. ONE BY MOUTH BEFORE BREAKFAST AND AT BEDTIME       . ondansetron (ZOFRAN-ODT) 4 MG disintegrating tablet Take 4 mg by mouth every 8 (eight) hours as needed.        . pentosan polysulfate (ELMIRON) 100 MG capsule Take 100 mg by mouth 3 (three) times daily before meals.        . promethazine (PHENERGAN) 25 MG tablet Take 25 mg by mouth daily as needed.        . solifenacin (VESICARE) 10 MG tablet Take 10 mg by mouth daily.        . Pediatric Multivit-Minerals-C (CHILDRENS VITAMINS PO) Take by mouth.          Allergies as of 08/11/2010  . (No Known Allergies)    Past Medical History  Diagnosis Date  . Allergic rhinitis   . Asthma   . Depression   . Hypothyroidism   . Psoriasis   . Overactive bladder   . GERD (gastroesophageal reflux disease)   . Hiatal hernia   . Paranoia   . Chronic constipation  Past Surgical History  Procedure Date  . Cholecystectomy 1999  . Colonoscopy 2011    single anal papilla  . Esophagogastroduodenoscopy 2006    patulous EGJ, small hh    Family History  Problem Relation Age of Onset  . Ulcers Mother   . Breast cancer Mother   . Diabetes Father     History   Social History  . Marital Status: Divorced    Spouse Name: N/A    Number of Children: 2  . Years of Education: N/A   Occupational History  . unemployed    Social History Main Topics  . Smoking status: Never Smoker   . Smokeless tobacco: Never Used  . Alcohol Use: No  . Drug Use: No  . Sexually Active: No     ROS:  General: Negative for anorexia, weight loss, fever, chills, fatigue, weakness. Eyes: Negative for vision changes.  ENT: Negative for hoarseness, difficulty swallowing , nasal congestion. CV: Negative for chest pain, angina, palpitations, dyspnea on exertion, peripheral edema.  Respiratory: Negative for dyspnea at rest,  dyspnea on exertion, cough, sputum, wheezing.  GI: See history of present illness. GU:  Negative for dysuria, hematuria, urinary incontinence, urinary frequency, nocturnal urination.  MS: Negative for joint pain, low back pain.  Derm: Negative for rash or itching.  Neuro: Negative for weakness, abnormal sensation, seizure, frequent headaches, memory loss, confusion.  Psych: Negative for anxiety, depression, suicidal ideation, hallucinations.  Endo: Negative for unusual weight change.  Heme: Negative for bruising or bleeding. Allergy: Negative for rash or hives.    Physical Examination:  BP 117/69  Pulse 84  Temp 98.5 F (36.9 C)  Ht 5\' 6"  (1.676 m)  Wt 267 lb 3.2 oz (121.201 kg)  BMI 43.13 kg/m2  LMP 07/27/2010   General: Well-nourished, well-developed in no acute distress.  Head: Normocephalic, atraumatic.   Eyes: Conjunctiva pink, no icterus. Mouth: Oropharyngeal mucosa moist and pink , no lesions erythema or exudate. Neck: Supple without thyromegaly, masses, or lymphadenopathy.  Lungs: Clear to auscultation bilaterally.  Heart: Regular rate and rhythm, no murmurs rubs or gallops.  Abdomen: Obese. Bowel sounds are normal, nontender, nondistended, no hepatosplenomegaly or masses, no abdominal bruits or    hernia , no rebound or guarding.   Extremities: No lower extremity edema.  Neuro: Alert and oriented x 4 , grossly normal neurologically.  Skin: Warm and dry, no rash or jaundice.   Psych: Alert and cooperative, normal mood and affect.

## 2010-08-16 ENCOUNTER — Ambulatory Visit: Payer: Medicare Other | Admitting: Gastroenterology

## 2010-09-05 NOTE — Assessment & Plan Note (Signed)
NAMEMarland Kitchen  Shirley Perry, Shirley Perry            CHART#:  [id number]   DATE:  01/26/2008                       DOB:  [birth year]   PRIMARY CARE PHYSICIAN:  Selinda Flavin, MD.   CHIEF COMPLAINT:  Followup for refills/GERD.   PROBLEM LIST:  1. Gastroesophageal reflux disease with patulous esophagogastric      junction and hiatal hernia.  2. Normal gastric emptying study.  3. Status post cholecystectomy.  4. Chronic constipation.  5. Hypercholesterolemia.  6. Anxiety.  7. Paranoia, depression.  8. Hypothyroidism.  9. Overactive bladder.  10.She had a negative small bowel follow-through on 11/14/2005.   SUBJECTIVE:  The patient is a 35 year old Caucasian female.  She has  been doing very well on her omeprazole 20 mg daily.  She rarely has  breakthrough symptoms.  She has chronic constipation and has responded  to MiraLax on a p.r.n. basis.  She is taking about once per week.  She  denies any rectal bleeding or melena.  She occasionally has upper  abdominal pain, which is rare and fleeting.  She has had nausea for the  last week, but attributes this to being on Azo.  She feels as though she  has a yeast infection.  She has also been using a vaginal cream and  cannot remember the name.  She denies any discharge.  Her bowel movement  has been normal, soft, and brown.  She has rare constipation since she  has been on MiraLax.  She denies any dysphagia and odynophagia.  Denies  any anorexia or early satiety.  Her weight had remained stable.  Her  last menstrual period was 01/15/2008.   CURRENT MEDICATIONS:  See the list from January 26, 2008.   ALLERGIES:  No known drug allergies.   FAMILY HISTORY:  There is no known family history of colorectal  carcinoma, liver or chronic GI problems.   SOCIAL HISTORY:  The patient is divorced.  She has 2 children who are  healthy.  She is disabled.  She denies any tobacco or drug use.  She  consumes about 1 glass of wine per month.   REVIEW OF SYSTEMS:  See  HPI, otherwise negative.   PHYSICAL EXAMINATION:  VITAL SIGNS:  Weight 255 pounds, height 65  inches, temperature 98, blood pressure 110/88, and pulse 64.  GENERAL:  She is an obese Caucasian female who is alert, oriented,  pleasant, and cooperative.  She does appear to have a depressed mood.  HEENT:  Sclerae clear and nonicteric.  Conjunctivae pink.  Oropharynx  pink and moist without any lesions.  NECK:  Supple without any mass or thyromegaly.  CHEST:  Heart regular rate and rhythm.  Normal S1 and S2.  No murmurs,  clicks, rubs, or gallops.  ABDOMEN:  Protuberant.  Positive bowel sounds x4.  No bruits  auscultated.  Soft, nontender, and nondistended without palpable mass or  hepatosplenomegaly.  No rebound, tenderness, or guarding.  EXTREMITIES:  Without clubbing or edema.   ASSESSMENT:  1. Chronic gastroesophageal reflux disease, well controlled on b.i.d.      proton pump inhibitor.  2. Intermittent nausea for about 1 week associated with the use of      Azo.  I have asked her to follow with Dr. Dimas Aguas regarding her      vaginal pruritus and possible yeast infection, so that Azo  can be      discontinued as I feel it could be attributing to her nausea.  Her      chronic constipation is well controlled on p.r.n. MiraLax.   PLAN:  1. Continue MiraLax p.r.n.  2. Omeprazole 20 mg b.i.d., #60 with 5 refills.  3. She is to call if her nausea returns, when she discontinues Azo.      Otherwise, we will follow up in 1 year or sooner if needed.       Lorenza Burton, N.P.  Electronically Signed     R. Roetta Sessions, M.D.  Electronically Signed    KJ/MEDQ  D:  01/26/2008  T:  01/26/2008  Job:  [id number]   cc:   Selinda Flavin

## 2010-09-08 NOTE — Op Note (Signed)
Shirley Perry, Shirley Perry           ACCOUNT NO.:  [id number]   MEDICAL RECORD NO.:  [id number]          PATIENT TYPE:  AMB   LOCATION:  DAY                           FACILITY:  [facility]   PHYSICIAN:  R. Roetta Sessions, M.D. DATE OF BIRTH:  21-Jun-1975   DATE OF PROCEDURE:  11/09/2004  DATE OF DISCHARGE:                                 OPERATIVE REPORT   PROCEDURE PERFORMED:  Diagnostic esophagogastroduodenoscopy.   INDICATIONS FOR PROCEDURE:  The patient is a 35 year old lady with at least  a seven month history of intermittent nausea and intermittent vomiting.  Gastroesophageal reflux disease symptoms have been well controlled on  Nexium.  She is on a multitude of medications.  EGD is now being done to  further evaluate her symptoms.  Her gallbladder is out. Her liver function  tests have been repeatedly normal.   PROCEDURE NOTE:  Oxygen saturations, blood pressure, pulse and respirations  were monitored throughout the entirety of the procedure.   CONSCIOUS SEDATION:  Versed 4 mg IV, Demerol 75 mg IV in divided doses.   INSTRUMENT USED:  Olympus video chip system.   FINDINGS:  Examination of the tubular esophagus revealed a patulous  esophagogastric junction.  Esophageal mucosa appeared normal.   Stomach:  The gastric cavity was empty and insufflated well with air.  Thorough examination of the gastric mucosa including retroflex view of the  proximal stomach, esophagogastric junction.  There was quite a bit of bile-  stained mucus. A small hiatal hernia.  Otherwise gastric mucosa appeared  normal.  Pylorus was patent and easily traversed.  Examination of the bulb,  second portion revealed no abnormalities.   THERAPY/DIAGNOSTIC MANEUVERS PERFORMED:  None.   The patient tolerated the procedure well was reacted in endoscopy.   IMPRESSION:  Patulous esophagogastric junction.  Small hiatal hernia, much  bile-stained gastric mucus.  Otherwise normal-appearing gastric mucosa.  Patent  pylorus, normal D1 and D2.   RECOMMENDATIONS:  Continue Nexium 40 mg orally daily.  Will go ahead and do  a solid phase gastric emptying study to assess for gastroparesis.  Further  recommendations to follow.       RMR/MEDQ  D:  11/09/2004  T:  11/09/2004  Job:  [id number]   cc:   Selinda Flavin  [address]  [place]  [place] [address]  Fax: [phone]

## 2010-09-08 NOTE — Consult Note (Signed)
[name], Perry           ACCOUNT NO.:  [id number]   MEDICAL RECORD NO.:  [id number]          PATIENT TYPE:  AMB   LOCATION:                                FACILITY:  [facility]   PHYSICIAN:  R. Roetta Sessions, M.D. DATE OF BIRTH:  04-10-1976   DATE OF CONSULTATION:  10/20/2004  DATE OF DISCHARGE:                                   CONSULTATION   REASON FOR CONSULTATION:  Nausea and vomiting.   REQUESTING PHYSICIAN:  Selinda Flavin, M.D., in [place], [place].   HISTORY OF PRESENT ILLNESS:  Shirley Perry is a 35 year old Caucasian female,  patient of Dr. Selinda Flavin who presents today for seven-month history of  nausea and vomiting which was quite persistent. Nausea occurs on a daily  basis, and she occasionally vomits. She wakes up in the morning nauseated.  It may last throughout the day. Often, it is worse with foods, especially  fatty foods. Sometimes when she eats, it helps the nausea. Denies any weight  loss. She has actually gained about 70 pounds in the last year. Denies any  abdominal pain, dysphagia, odynophagia, heartburn, hematemesis, diarrhea.  She has intermittent constipation. No melena or rectal bleeding. She takes  promethazine p.r.n. nausea which seems to help. It makes her very drowsy.  Therefore, she does not take it very much. She denies any new medications  around the time of the beginning of the symptoms. Workup has included  repetitive LFTs which have been unremarkable according to [employer]  C., office note. She is status post cholecystectomy. She has an unremarkable  abdominal ultrasound November 2005 except for borderline spleen.   CURRENT MEDICATIONS:  1.  Levoxyl 25 mcg q.d.  2.  Hyoscyamine 0.375 mg b.i.d.  3.  Nexium 40 mg q.d.  4.  Vytorin 10/20 mg q.d.  5.  Cymbalta 20 mg q.h.s.  6.  Glycalox p.r.n.  7.  Nasonex p.r.n.  8.  Lodine 40 mg p.r.n.  9.  Topamax 1 q.h.s.  10. Lorazepam 0.5 mg b.i.d.  11. Promethazine 25 mg p.r.n.  12.  Multivitamin q.d.  13. Caltrate q.d.  14. FiberChoice p.r.n.   ALLERGIES:  No known drug allergies.   PAST MEDICAL HISTORY:  1.  Hypercholesterolemia.  2.  Anxiety, paranoia, depression, followed by mental health.  3.  Hypothyroidism.  4.  Gastroesophageal reflux disease.  5.  Overactive bladder followed by Dr. Rito Ehrlich.  6.  Cholecystectomy in 1999.   FAMILY HISTORY:  Mother is 15, has been treated for breast cancer but doing  well. Father is deceased at age 59. He had a history of diabetes mellitus.  No family history of colorectal cancer or liver disease.   SOCIAL HISTORY:  She is divorced. She has two children. She is unemployed.  She has never been a smoker. Denies any alcohol use.   REVIEW OF SYSTEMS:  See HPI for GI. CARDIOPULMONARY:  Denies any chest pain  or shortness of breath. CONSTITUTIONAL:  Complains of 70-pound weight gain  in a year. GENITOURINARY:  Her menstrual cycle is irregular. No dysuria.   PHYSICAL EXAMINATION:  VITAL SIGNS:  Weight  253, height 5 foot 5.  Temperature 98.4, blood pressure 104/60, pulse 78.  GENERAL:  Pleasant, obese, Caucasian female in no acute distress. She has  very flat affect and is slow to respond.  SKIN:  Warm and dry. No jaundice.  HEENT:  Pupils are equal, round, and reactive to light. Conjunctivae are  pink. Sclerae are nonicteric. Oropharyngeal mucosa moist and pink. No  lesions, erythema, or exudate. No lymphadenopathy, thyromegaly.  LUNGS:  Clear to auscultation.  CARDIAC:  Reveals regular rate and rhythm. Normal S1 and S2. No murmurs,  rubs, or gallops.  ABDOMEN:  Positive bowel sounds. Obese but symmetrical, soft. Mild  epigastric tenderness to deep palpation. No rebound tenderness or guarding.  No abdominal bruits or hernias.  EXTREMITIES:  No edema.   IMPRESSION:  Ceilidh is a 36 year old with a 74-month history of persistent  nausea with intermittent vomiting without weight loss or abdominal pain. She  has mental  illness significant for anxiety, depression, and paranoia  followed by mental health. She also has chronic gastroesophageal reflux  disease with good control of typical heartburn symptoms on Nexium. I spoke  to her at length today that chronic nausea in the setting of no abdominal  pain or other GI symptoms may be a manifestation of atypical GERD but more  likely related to depression. Having said that, I did offer her upper  endoscopy for further evaluation of her upper GI tract as well as to rule  out complications of chronic GERD such as Barrett's esophagus, etc. I  discussed risks, alternatives, and benefits with the patient, and she is  agreeable to proceed.   PLAN:  1.  EGD in the near future.  2.  Continue Nexium 40 mg daily.  3.  Further recommendations to follow.      Tana Coast, P.AJonathon Bellows, M.D.  Electronically Signed    LL/MEDQ  D:  10/20/2004  T:  10/20/2004  Job:  [id number]   cc:   Selinda Flavin  [address] [place]  [place]  [place] [address]  Fax: [phone]

## 2010-10-12 ENCOUNTER — Encounter: Payer: Self-pay | Admitting: Gastroenterology

## 2010-10-12 ENCOUNTER — Ambulatory Visit (INDEPENDENT_AMBULATORY_CARE_PROVIDER_SITE_OTHER): Payer: Medicare Other | Admitting: Gastroenterology

## 2010-10-12 VITALS — BP 119/74 | HR 81 | Temp 97.0°F | Ht 66.0 in | Wt 249.2 lb

## 2010-10-12 DIAGNOSIS — K5909 Other constipation: Secondary | ICD-10-CM

## 2010-10-12 DIAGNOSIS — K219 Gastro-esophageal reflux disease without esophagitis: Secondary | ICD-10-CM

## 2010-10-12 DIAGNOSIS — R110 Nausea: Secondary | ICD-10-CM

## 2010-10-12 MED ORDER — LACTULOSE 10 GM/15ML PO SOLN: 20.0000 g | Freq: Two times a day (BID) | ORAL | Status: DC

## 2010-10-12 NOTE — Progress Notes (Signed)
Primary Care Physician:  Criss Rosales, MD  Primary Gastroenterologist:  Roetta Sessions, MD  Chief Complaint  Patient presents with  . Medication Refill  . EGD    HPI:  Shirley Perry is a 35 y.o. female here to schedule EGD. She never called to schedule it after her appt in April.  She has chronic nausea which has been going on for a long time. Up until recently she has had a steady weight gain over last couple years. Her maximum weight per patient was 280. She is down to 249 (intentional). She has lost weight on a new low-carb diet. She wakes up the morning with nausea. She has no vomiting. States her reflux as been refractory. Denies dysphagia. No significant abdominal pain. Bowel movements continue to be difficult for her. She has chronic constipation. Currently on lactulose as she states MiraLax stopped working. She's taking 2 tablespoons of lactulose twice a day, some days has good results but other days none. Having 6 small BMs daily but never gets "relief". No recent rectal bleeding. No melena. No recent change in medications. Trying to limit phenergan and zofran due to side effects.   Current Outpatient Prescriptions  Medication Sig Dispense Refill  . albuterol (PROVENTIL,VENTOLIN) 90 MCG/ACT inhaler Inhale 2 puffs into the lungs every 6 (six) hours as needed.        . Cinnamon 500 MG capsule Take 2,000 mg by mouth daily.        Marland Kitchen ezetimibe-simvastatin (VYTORIN) 10-20 MG per tablet Take 1 tablet by mouth daily.        . Lactobacillus (ACIDOPHILUS) 100 MG CAPS Take 1 capsule by mouth as needed.        . lactulose (CHRONULAC) 10 GM/15ML solution Take 30 mLs (20 g total) by mouth 2 (two) times daily.  240 mL  5  . levothyroxine (SYNTHROID, LEVOTHROID) 75 MCG tablet Take 75 mcg by mouth daily.        Marland Kitchen LORazepam (ATIVAN) 1 MG tablet Take 1 mg by mouth 3 (three) times daily as needed.        . Lurasidone HCl (LATUDA) 40 MG TABS Take by mouth daily.        . mometasone (ELOCON) 0.1 %  cream Apply 1 application topically daily.        Marland Kitchen omeprazole (PRILOSEC) 40 MG capsule Take 40 mg by mouth. ONE BY MOUTH BEFORE BREAKFAST AND AT BEDTIME       . ondansetron (ZOFRAN-ODT) 4 MG disintegrating tablet Take 4 mg by mouth every 8 (eight) hours as needed.        . Pediatric Multivit-Minerals-C (CHILDRENS VITAMINS PO) Take by mouth.        . pentosan polysulfate (ELMIRON) 100 MG capsule Take 100 mg by mouth 3 (three) times daily before meals.        . promethazine (PHENERGAN) 25 MG tablet Take 25 mg by mouth daily as needed.        . solifenacin (VESICARE) 10 MG tablet Take 10 mg by mouth daily.        Marland Kitchen DISCONTD: lactulose (CHRONULAC) 10 GM/15ML solution Take 20 g by mouth 2 (two) times daily.         Allergies as of 10/12/2010  . (No Known Allergies)    Past Medical History  Diagnosis Date  . Allergic rhinitis   . Asthma   . Depression   . Hypothyroidism   . Psoriasis   . Overactive bladder   . GERD (  gastroesophageal reflux disease)   . Hiatal hernia   . Paranoia   . Chronic constipation     Past Surgical History  Procedure Date  . Cholecystectomy 1999  . Colonoscopy 2011    single anal papilla  . Esophagogastroduodenoscopy 2006    patulous EGJ, small hh    Family History  Problem Relation Age of Onset  . Ulcers Mother   . Breast cancer Mother   . Diabetes Father     History   Social History  . Marital Status: Divorced    Spouse Name: N/A    Number of Children: 2  . Years of Education: N/A   Occupational History  . unemployed    Social History Main Topics  . Smoking status: Never Smoker   . Smokeless tobacco: Never Used  . Alcohol Use: No  . Drug Use: No  . Sexually Active: No   Other Topics Concern  . Not on file   Social History Narrative  . No narrative on file      ROS:  General: Negative for anorexia, fever, chills, fatigue, weakness. Eyes: Negative for vision changes.  ENT: Negative for hoarseness, difficulty swallowing  , nasal congestion. CV: Negative for chest pain, angina, palpitations, dyspnea on exertion, peripheral edema.  Respiratory: Negative for dyspnea at rest, dyspnea on exertion, cough, sputum, wheezing.  GI: See history of present illness. GU:  Negative for dysuria, hematuria, urinary incontinence, urinary frequency, nocturnal urination.  MS: Negative for joint pain, low back pain.  Derm: Negative for rash or itching.  Neuro: Negative for weakness, abnormal sensation, seizure, frequent headaches, memory loss, confusion.  Psych: Negative for anxiety, depression, suicidal ideation, hallucinations.  Endo: Negative for unusual weight change.  Heme: Negative for bruising or bleeding. Allergy: Negative for rash or hives.    Physical Examination:  BP 119/74  Pulse 81  Temp(Src) 97 F (36.1 C) (Temporal)  Ht 5\' 6"  (1.676 m)  Wt 249 lb 3.2 oz (113.036 kg)  BMI 40.22 kg/m2  LMP 09/12/2010   General: Well-nourished, well-developed in no acute distress.  Head: Normocephalic, atraumatic.   Eyes: Conjunctiva pink, no icterus. Mouth: Oropharyngeal mucosa moist and pink , no lesions erythema or exudate. Neck: Supple without thyromegaly, masses, or lymphadenopathy.  Lungs: Clear to auscultation bilaterally.  Heart: Regular rate and rhythm, no murmurs rubs or gallops.  Abdomen: Bowel sounds are normal, nontender, nondistended, no hepatosplenomegaly or masses, no abdominal bruits or    hernia , no rebound or guarding.   Extremities: No lower extremity edema.  Neuro: Alert and oriented x 4 , grossly normal neurologically.  Skin: Warm and dry, no rash or jaundice.   Psych: Alert and cooperative, normal mood and affect.

## 2010-10-12 NOTE — Assessment & Plan Note (Signed)
Continue lactulose. New RX given. Consider adding Miralax if she doesn't feels she is getting good evacuation. Avoid Amitiza for now given chronic nausea.

## 2010-10-12 NOTE — Assessment & Plan Note (Signed)
Chronic nausea without vomiting. Some breakthrough heartburn on omeprazole bid. Daily symptoms. Never had EGD done. Arrange for EGD with deep sedation given her h/o polypharmacy and inadequate conscious sedation.  I have discussed the risks, alternatives, benefits with regards to but not limited to the risk of reaction to medication, bleeding, infection, perforation and the patient is agreeable to proceed. Written consent to be obtained.

## 2010-10-12 NOTE — Progress Notes (Signed)
Cc to PCP 

## 2010-10-12 NOTE — Assessment & Plan Note (Signed)
See Nausea assessment and plan.

## 2010-10-13 ENCOUNTER — Ambulatory Visit: Payer: Medicare Other | Admitting: Urgent Care

## 2010-10-31 ENCOUNTER — Encounter: Payer: Self-pay | Admitting: General Practice

## 2010-11-06 ENCOUNTER — Other Ambulatory Visit (HOSPITAL_COMMUNITY): Payer: Medicare Other

## 2010-11-08 ENCOUNTER — Encounter (HOSPITAL_COMMUNITY)
Admission: RE | Admit: 2010-11-08 | Discharge: 2010-11-08 | Disposition: A | Discharge status: Home / Self Care | Payer: Medicare Other | Source: Ambulatory Visit | Attending: Internal Medicine | Admitting: Internal Medicine

## 2010-11-08 ENCOUNTER — Encounter (HOSPITAL_COMMUNITY): Payer: Self-pay

## 2010-11-08 LAB — BASIC METABOLIC PANEL
BUN: 7 mg/dL (ref 6–23)
Chloride: 103 mEq/L (ref 96–112)
GFR calc Af Amer: >60 mL/min (ref >60)
Glucose, Bld: 84 mg/dL (ref 70–99)
Potassium: 4 mEq/L (ref 3.5–5.1)

## 2010-11-08 LAB — CBC
HCT: 38.1 % (ref 36.0–46.0)
Hemoglobin: 12.8 g/dL (ref 12.0–15.0)
MCH: 26.7 pg (ref 26.0–34.0)
MCHC: 33.6 g/dL (ref 30.0–36.0)

## 2010-11-08 NOTE — Patient Instructions (Addendum)
20 Shirley Perry  11/08/2010   Your procedure is scheduled on:  11/10/10  Report to [facility] at 615  AM.  Call this number if you have problems the morning of surgery: [phone]   Remember:   Do not eat food:After Midnight.  Do not drink clear liquids: After Midnight.  Take these medicines the morning of surgery with A SIP OF WATER: ativan,levothyroxine,omeprazole   Do not wear jewelry, make-up or nail polish.  Do not bring valuables to the hospital.  Contacts, dentures or bridgework may not be worn into surgery.  Leave suitcase in the car. After surgery it may be brought to your room.  For patients admitted to the hospital, checkout time is 11:00 AM the day of discharge.   Patients discharged the day of surgery will not be allowed to drive home.  Name and phone number of your driver: sister  Special Instructions: N/A   Please read over the following fact sheets that you were given: Pain Booklet and Anesthesia Post-op Instructions PATIENT INSTRUCTIONS POST-ANESTHESIA  IMMEDIATELY FOLLOWING SURGERY:  Do not drive or operate machinery for the first twenty four hours after surgery.  Do not make any important decisions for twenty four hours after surgery or while taking narcotic pain medications or sedatives.  If you develop intractable nausea and vomiting or a severe headache please notify your doctor immediately.  FOLLOW-UP:  Please make an appointment with your surgeon as instructed. You do not need to follow up with anesthesia unless specifically instructed to do so.  WOUND CARE INSTRUCTIONS (if applicable):  Keep a dry clean dressing on the anesthesia/puncture wound site if there is drainage.  Once the wound has quit draining you may leave it open to air.  Generally you should leave the bandage intact for twenty four hours unless there is drainage.  If the epidural site drains for more than 36-48 hours please call the anesthesia department.  QUESTIONS?:  Please feel free to call  your physician or the hospital operator if you have any questions, and they will be happy to assist you.     [facility] Anesthesia Department [address] [place] [phone]

## 2010-11-10 ENCOUNTER — Encounter (HOSPITAL_COMMUNITY)
Admission: RE | Disposition: A | Discharge status: Home / Self Care | Payer: Self-pay | Source: Ambulatory Visit | Attending: Internal Medicine

## 2010-11-10 ENCOUNTER — Encounter (HOSPITAL_COMMUNITY): Payer: Self-pay | Admitting: Anesthesiology

## 2010-11-10 ENCOUNTER — Encounter (HOSPITAL_COMMUNITY): Payer: Self-pay | Admitting: *Deleted

## 2010-11-10 ENCOUNTER — Ambulatory Visit (HOSPITAL_COMMUNITY)
Admission: RE | Admit: 2010-11-10 | Discharge: 2010-11-10 | Disposition: A | Discharge status: Home / Self Care | Payer: Medicare Other | Source: Ambulatory Visit | Attending: Internal Medicine | Admitting: Internal Medicine

## 2010-11-10 ENCOUNTER — Encounter: Payer: Medicare Other | Admitting: Internal Medicine

## 2010-11-10 ENCOUNTER — Telehealth: Payer: Self-pay

## 2010-11-10 ENCOUNTER — Ambulatory Visit (HOSPITAL_COMMUNITY): Payer: Medicare Other | Admitting: Anesthesiology

## 2010-11-10 DIAGNOSIS — R110 Nausea: Secondary | ICD-10-CM | POA: Insufficient documentation

## 2010-11-10 DIAGNOSIS — K5909 Other constipation: Secondary | ICD-10-CM | POA: Insufficient documentation

## 2010-11-10 DIAGNOSIS — Z01812 Encounter for preprocedural laboratory examination: Secondary | ICD-10-CM | POA: Insufficient documentation

## 2010-11-10 DIAGNOSIS — K449 Diaphragmatic hernia without obstruction or gangrene: Secondary | ICD-10-CM

## 2010-11-10 HISTORY — PX: ESOPHAGOGASTRODUODENOSCOPY: SHX5428

## 2010-11-10 SURGERY — EGD (ESOPHAGOGASTRODUODENOSCOPY)
Anesthesia: Monitor Anesthesia Care

## 2010-11-10 MED ORDER — PROPOFOL 10 MG/ML IV EMUL
INTRAVENOUS | Status: DC | PRN
  Administered 2010-11-10: 100 ug/kg/min via INTRAVENOUS

## 2010-11-10 MED ORDER — SURGILUBE EX GEL
CUTANEOUS | Status: DC | PRN
  Administered 2010-11-10: 1 via TOPICAL

## 2010-11-10 MED ORDER — ONDANSETRON HCL 4 MG/2ML IJ SOLN
4.0000 mg | Freq: Once | INTRAMUSCULAR | Status: AC
  Administered 2010-11-10: 4 mg via INTRAVENOUS

## 2010-11-10 MED ORDER — ONDANSETRON HCL 4 MG/2ML IJ SOLN
INTRAMUSCULAR | Status: AC
  Administered 2010-11-10: 4 mg via INTRAVENOUS
  Filled 2010-11-10: qty 2

## 2010-11-10 MED ORDER — MIDAZOLAM HCL 5 MG/ML IJ SOLN
INTRAMUSCULAR | Status: DC | PRN
  Administered 2010-11-10: 2 mg via INTRAVENOUS

## 2010-11-10 MED ORDER — BUTAMBEN-TETRACAINE-BENZOCAINE 2-2-14 % EX AERO
1.0000 | INHALATION_SPRAY | Freq: Once | CUTANEOUS | Status: AC
  Administered 2010-11-10: 1 via TOPICAL
  Filled 2010-11-10: qty 56

## 2010-11-10 MED ORDER — PROPOFOL 10 MG/ML IV EMUL
INTRAVENOUS | Status: AC
  Filled 2010-11-10: qty 20

## 2010-11-10 MED ORDER — LIDOCAINE HCL (PF) 1 % IJ SOLN
INTRAMUSCULAR | Status: AC
  Filled 2010-11-10: qty 5

## 2010-11-10 MED ORDER — MIDAZOLAM HCL 2 MG/2ML IJ SOLN
INTRAMUSCULAR | Status: AC
  Administered 2010-11-10: 2 mg via INTRAVENOUS
  Filled 2010-11-10: qty 2

## 2010-11-10 MED ORDER — STERILE WATER FOR IRRIGATION IR SOLN
Status: DC | PRN
  Administered 2010-11-10 (×2)

## 2010-11-10 MED ORDER — MIDAZOLAM HCL 2 MG/2ML IJ SOLN
1.0000 mg | INTRAMUSCULAR | Status: DC | PRN
  Administered 2010-11-10: 2 mg via INTRAVENOUS

## 2010-11-10 MED ORDER — GLYCOPYRROLATE 0.2 MG/ML IJ SOLN
0.2000 mg | Freq: Once | INTRAMUSCULAR | Status: AC | PRN
  Administered 2010-11-10: 0.2 mg via INTRAVENOUS

## 2010-11-10 MED ORDER — LACTATED RINGERS IV SOLN
INTRAVENOUS | Status: DC | PRN
  Administered 2010-11-10: 07:00:00 via INTRAVENOUS

## 2010-11-10 MED ORDER — GLYCOPYRROLATE 0.2 MG/ML IJ SOLN
INTRAMUSCULAR | Status: AC
  Filled 2010-11-10: qty 1

## 2010-11-10 MED ORDER — MIDAZOLAM HCL 2 MG/2ML IJ SOLN
INTRAMUSCULAR | Status: AC
  Filled 2010-11-10: qty 2

## 2010-11-10 MED ORDER — LACTATED RINGERS IV SOLN
INTRAVENOUS | Status: DC
  Administered 2010-11-10: 07:00:00 via INTRAVENOUS

## 2010-11-10 SURGICAL SUPPLY — 19 items
BLOCK BITE 60FR ADLT L/F BLUE (MISCELLANEOUS) ×2 IMPLANT
DEVICE CLIP HEMOSTAT 235CM (CLIP) IMPLANT
ELECT REM PT RETURN 9FT ADLT (ELECTROSURGICAL)
ELECTRODE REM PT RTRN 9FT ADLT (ELECTROSURGICAL) IMPLANT
FLOOR PAD 36X40 (MISCELLANEOUS) ×2
FORCEP RJ3 GP 1.8X160 W-NEEDLE (CUTTING FORCEPS) ×2 IMPLANT
FORCEPS BIOP RAD 4 LRG CAP 4 (CUTTING FORCEPS) ×2 IMPLANT
MANIFOLD NEPTUNE WASTE (CANNULA) ×2 IMPLANT
NDL SCLEROTHERAPY 25GX240 (NEEDLE) ×1 IMPLANT
NEEDLE SCLEROTHERAPY 25GX240 (NEEDLE) ×2 IMPLANT
PAD FLOOR 36X40 (MISCELLANEOUS) ×1 IMPLANT
PROBE APC STR FIRE (PROBE) ×2 IMPLANT
PROBE INJECTION GOLD (MISCELLANEOUS) ×2
PROBE INJECTION GOLD 7FR (MISCELLANEOUS) ×1 IMPLANT
SNARE ROTATE MED OVAL 20MM (MISCELLANEOUS) ×2 IMPLANT
SYR 50ML LL SCALE MARK (SYRINGE) ×1 IMPLANT
TUBING ENDO SMARTCAP (MISCELLANEOUS) ×2 IMPLANT
TUBING IRRIGATION ENDOGATOR (MISCELLANEOUS) ×2 IMPLANT
WATER STERILE IRR 1000ML POUR (IV SOLUTION) ×1 IMPLANT

## 2010-11-10 NOTE — Anesthesia Postprocedure Evaluation (Signed)
  Anesthesia Post-op Note  Patient: Shirley Perry  Procedure(s) Performed:  ESOPHAGOGASTRODUODENOSCOPY (EGD) - with propofol  Patient Location: PACU and Short Stay  Anesthesia Type: MAC  Level of Consciousness: awake, alert  and oriented  Airway and Oxygen Therapy: Patient Spontanous Breathing  Post-op Pain: none  Post-op Assessment: Post-op Vital signs reviewed, Patient's Cardiovascular Status Stable, Respiratory Function Stable, Patent Airway, No signs of Nausea or vomiting and Adequate PO intake  Post-op Vital Signs: stable  Complications: No apparent anesthesia complications

## 2010-11-10 NOTE — Transfer of Care (Signed)
Immediate Anesthesia Transfer of Care Note  Patient: Shirley Perry  Procedure(s) Performed:  ESOPHAGOGASTRODUODENOSCOPY (EGD)  Patient Location: PACU  Anesthesia Type: MAC  Level of Consciousness: awake, alert  and oriented  Airway & Oxygen Therapy: Patient Spontanous Breathing  Post-op Assessment: Report given to PACU RN  Post vital signs: stable  Complications: No apparent anesthesia complications

## 2010-11-10 NOTE — Anesthesia Preprocedure Evaluation (Addendum)
Anesthesia Evaluation  Name, MR# and DOB Patient awake  General Assessment Comment  Reviewed: Allergy & Precautions, H&P  and Patient's Chart, lab work & pertinent test results  Airway       Dental   Pulmonary  asthma      Cardiovascular    Neuro/Psych (+) {AN ROS/MED HX NEURO HEADACHES (+) PSYCHIATRIC DISORDERS, Anxiety, Depression,  Neuromuscular disease  GI/Hepatic/Renal negative Liver ROS, and negative Renal ROS (+) hiatal hernia,  GERD Medicated and Controlled     Endo/Other   (+)  Hypothyroidism,  Abdominal   Musculoskeletal negative musculoskeletal ROS (+)  Hematology negative hematology ROS (+)   Peds  Reproductive/Obstetrics negative OB ROS   Anesthesia Other Findings             Anesthesia Physical Anesthesia Plan  ASA: II  Anesthesia Plan: MAC   Post-op Pain Management:    Induction: Intravenous  Airway Management Planned: Nasal Cannula  Additional Equipment:   Intra-op Plan:   Post-operative Plan:   Informed Consent: I have reviewed the patients History and Physical, chart, labs and discussed the procedure including the risks, benefits and alternatives for the proposed anesthesia with the patient or authorized representative who has indicated his/her understanding and acceptance.     Plan Discussed with: CRNA  Anesthesia Plan Comments:         Anesthesia Quick Evaluation

## 2010-11-10 NOTE — Telephone Encounter (Signed)
Per Dr. Jena Gauss, Dexilant 60 mg #20 (# 4 boxes ) placed at front for pick up.

## 2010-11-10 NOTE — H&P (Signed)
Tana Coast, PA  10/12/2010 11:52 AM  Signed Primary Care Physician:  Criss Rosales, MD   Primary Gastroenterologist:  Roetta Sessions, MD    Chief Complaint   Patient presents with   .  Medication Refill   .  EGD      HPI:  Shirley Perry is a 35 y.o. female here to schedule EGD. She never called to schedule it after her appt in April.  She has chronic nausea which has been going on for a long time. Up until recently she has had a steady weight gain over last couple years. Her maximum weight per patient was 280. She is down to 249 (intentional). She has lost weight on a new low-carb diet. She wakes up the morning with nausea. She has no vomiting. States her reflux as been refractory. Denies dysphagia. No significant abdominal pain. Bowel movements continue to be difficult for her. She has chronic constipation. Currently on lactulose as she states MiraLax stopped working. She's taking 2 tablespoons of lactulose twice a day, some days has good results but other days none. Having 6 small BMs daily but never gets "relief". No recent rectal bleeding. No melena. No recent change in medications. Trying to limit phenergan and zofran due to side effects.    Current Outpatient Prescriptions   Medication  Sig  Dispense  Refill   .  albuterol (PROVENTIL,VENTOLIN) 90 MCG/ACT inhaler  Inhale 2 puffs into the lungs every 6 (six) hours as needed.           .  Cinnamon 500 MG capsule  Take 2,000 mg by mouth daily.           Marland Kitchen  ezetimibe-simvastatin (VYTORIN) 10-20 MG per tablet  Take 1 tablet by mouth daily.           .  Lactobacillus (ACIDOPHILUS) 100 MG CAPS  Take 1 capsule by mouth as needed.           .  lactulose (CHRONULAC) 10 GM/15ML solution  Take 30 mLs (20 g total) by mouth 2 (two) times daily.   240 mL   5   .  levothyroxine (SYNTHROID, LEVOTHROID) 75 MCG tablet  Take 75 mcg by mouth daily.           Marland Kitchen  LORazepam (ATIVAN) 1 MG tablet  Take 1 mg by mouth 3 (three) times daily as needed.            .  Lurasidone HCl (LATUDA) 40 MG TABS  Take by mouth daily.           .  mometasone (ELOCON) 0.1 % cream  Apply 1 application topically daily.           Marland Kitchen  omeprazole (PRILOSEC) 40 MG capsule  Take 40 mg by mouth. ONE BY MOUTH BEFORE BREAKFAST AND AT BEDTIME          .  ondansetron (ZOFRAN-ODT) 4 MG disintegrating tablet  Take 4 mg by mouth every 8 (eight) hours as needed.           .  Pediatric Multivit-Minerals-C (CHILDRENS VITAMINS PO)  Take by mouth.           .  pentosan polysulfate (ELMIRON) 100 MG capsule  Take 100 mg by mouth 3 (three) times daily before meals.           .  promethazine (PHENERGAN) 25 MG tablet  Take 25 mg by mouth daily as needed.           Marland Kitchen  solifenacin (VESICARE) 10 MG tablet  Take 10 mg by mouth daily.           Marland Kitchen  DISCONTD: lactulose (CHRONULAC) 10 GM/15ML solution  Take 20 g by mouth 2 (two) times daily.              Allergies as of 10/12/2010   .  (No Known Allergies)       Past Medical History   Diagnosis  Date   .  Allergic rhinitis     .  Asthma     .  Depression     .  Hypothyroidism     .  Psoriasis     .  Overactive bladder     .  GERD (gastroesophageal reflux disease)     .  Hiatal hernia     .  Paranoia     .  Chronic constipation         Past Surgical History   Procedure  Date   .  Cholecystectomy  1999   .  Colonoscopy  2011       single anal papilla   .  Esophagogastroduodenoscopy  2006       patulous EGJ, small hh       Family History   Problem  Relation  Age of Onset   .  Ulcers  Mother     .  Breast cancer  Mother     .  Diabetes  Father         History       Social History   .  Marital Status:  Divorced       Spouse Name:  N/A       Number of Children:  2   .  Years of Education:  N/A       Occupational History   .  unemployed         Social History Main Topics   .  Smoking status:  Never Smoker    .  Smokeless tobacco:  Never Used   .  Alcohol Use:  No   .  Drug Use:  No   .  Sexually Active:   No       Other Topics  Concern   .  Not on file       Social History Narrative   .  No narrative on file        ROS:   General: Negative for anorexia, fever, chills, fatigue, weakness. Eyes: Negative for vision changes.   ENT: Negative for hoarseness, difficulty swallowing , nasal congestion. CV: Negative for chest pain, angina, palpitations, dyspnea on exertion, peripheral edema.   Respiratory: Negative for dyspnea at rest, dyspnea on exertion, cough, sputum, wheezing.   GI: See history of present illness. GU:  Negative for dysuria, hematuria, urinary incontinence, urinary frequency, nocturnal urination.   MS: Negative for joint pain, low back pain.   Derm: Negative for rash or itching.   Neuro: Negative for weakness, abnormal sensation, seizure, frequent headaches, memory loss, confusion.   Psych: Negative for anxiety, depression, suicidal ideation, hallucinations.   Endo: Negative for unusual weight change.   Heme: Negative for bruising or bleeding. Allergy: Negative for rash or hives.     Physical Examination:   BP 119/74  Pulse 81  Temp(Src) 97 F (36.1 C) (Temporal)  Ht 5\' 6"  (1.676 m)  Wt 249 lb 3.2 oz (113.036 kg)  BMI 40.22 kg/m2  LMP 09/12/2010    General: Well-nourished,  well-developed in no acute distress.   Head: Normocephalic, atraumatic.    Eyes: Conjunctiva pink, no icterus. Mouth: Oropharyngeal mucosa moist and pink , no lesions erythema or exudate. Neck: Supple without thyromegaly, masses, or lymphadenopathy.   Lungs: Clear to auscultation bilaterally.   Heart: Regular rate and rhythm, no murmurs rubs or gallops.   Abdomen: Bowel sounds are normal, nontender, nondistended, no hepatosplenomegaly or masses, no abdominal bruits or    hernia , no rebound or guarding.    Extremities: No lower extremity edema.   Neuro: Alert and oriented x 4 , grossly normal neurologically.   Skin: Warm and dry, no rash or jaundice.    Psych: Alert and cooperative,  normal mood and affect.           Glendora Score  10/12/2010 12:06 PM  Signed Cc to PCP        NAUSEA - Tana Coast, PA  10/12/2010 11:50 AM  Signed Chronic nausea without vomiting. Some breakthrough heartburn on omeprazole bid. Daily symptoms. Never had EGD done. Arrange for EGD with deep sedation given her h/o polypharmacy and inadequate conscious sedation.  I have discussed the risks, alternatives, benefits with regards to but not limited to the risk of reaction to medication, bleeding, infection, perforation and the patient is agreeable to proceed. Written consent to be obtained.       CONSTIPATION, CHRONIC - Tana Coast, PA  10/12/2010 11:51 AM  Signed Continue lactulose. New RX given. Consider adding Miralax if she doesn't feels she is getting good evacuation. Avoid Amitiza for now given chronic nausea.    GERD - Tana Coast, PA  10/12/2010 11:52 AM  Signed See Nausea assessment and plan.   I have seen the patient prior to the procedure(s) today and reviewed the history and physical / consultation from  10/12/10.  There have been no changes. After consideration of the risks, benefits, alternatives and imponderables, the patient has consented to the procedure(s).

## 2010-11-10 NOTE — Anesthesia Postprocedure Evaluation (Signed)
  Anesthesia Post-op Note  Patient: Shirley Perry  Procedure(s) Performed:  ESOPHAGOGASTRODUODENOSCOPY (EGD) - with propofol  Patient Location: PACU  Anesthesia Type: MAC  Level of Consciousness: awake, alert  and oriented  Airway and Oxygen Therapy: Patient Spontanous Breathing  Post-op Pain: none  Post-op Assessment: Post-op Vital signs reviewed, Patient's Cardiovascular Status Stable, Respiratory Function Stable, Patent Airway and No signs of Nausea or vomiting  Post-op Vital Signs: stable  Complications: No apparent anesthesia complications

## 2010-11-11 ENCOUNTER — Other Ambulatory Visit: Payer: Self-pay | Admitting: Internal Medicine

## 2010-11-16 ENCOUNTER — Other Ambulatory Visit: Payer: Self-pay

## 2010-11-20 ENCOUNTER — Encounter (HOSPITAL_COMMUNITY): Payer: Self-pay | Admitting: Internal Medicine

## 2010-11-22 ENCOUNTER — Other Ambulatory Visit: Payer: Self-pay

## 2010-11-22 NOTE — Telephone Encounter (Signed)
Please call pt.  She was given dexilant samples by RMR. Does she need Rx for dexilant?

## 2010-11-22 NOTE — Telephone Encounter (Signed)
Called pt- she said Dexilant made her symptoms worse and the prilosec worked better.

## 2010-11-23 MED ORDER — OMEPRAZOLE 40 MG PO CPDR: 40.0000 mg | DELAYED_RELEASE_CAPSULE | Freq: Every day | ORAL | Status: DC

## 2010-12-13 ENCOUNTER — Encounter: Payer: Self-pay | Admitting: Gastroenterology

## 2010-12-13 ENCOUNTER — Ambulatory Visit (INDEPENDENT_AMBULATORY_CARE_PROVIDER_SITE_OTHER): Payer: Medicare Other | Admitting: Gastroenterology

## 2010-12-13 DIAGNOSIS — R110 Nausea: Secondary | ICD-10-CM

## 2010-12-13 DIAGNOSIS — K5909 Other constipation: Secondary | ICD-10-CM

## 2010-12-13 MED ORDER — LUBIPROSTONE 24 MCG PO CAPS: 24.0000 ug | ORAL_CAPSULE | Freq: Two times a day (BID) | ORAL | Status: DC

## 2010-12-13 NOTE — Patient Instructions (Addendum)
For the next two weeks, let's try to simplify your medications to see if it helps your chronic nausea. Consider stopping the following medications for the next two weeks: lactulose, colace, cinnamon, fiber chew, multivitamin. Start Amitiza twice daily as needed for constipation (take with food). Avoid pregnancy on this medicaiton.  Call in 2 weeks with progress report. If still with nausea, bloating, gas, consider hydrogen breath test.      Gas  (Flatulence) Burping releases air that you swallowed. The bubbles from some drinks may cause burps. There are good bacteria in your gut to help you digest food. Gas is produced by these bacteria and released from your bottom. Most people release 3 to 4 quarts of gas every day. This is normal. HOME CARE  Eat or drink less of the foods or liquids that give you gas.   Foods that give you gas may be added to your diet a little at a time.   Take the time to chew your food well. Talk less while you eat.   Do not suck on ice or hard candy.   Sip slowly. Stir some of the bubbles out of fizzy drinks with a spoon or straw.   Chewing gum or smoking may cause you to swallow more air.   Ask about liquids and tablets that may help control burping and gas.   Only take medicine as directed by your doctor.  GET HELP RIGHT AWAY IF:  There is discomfort when you burp or pass gas.   Your throw up (vomit) comes up when you burp.   Poop (stool) comes out when you pass gas.   Your belly is swollen and hard.  MAKE SURE YOU:   Understand these instructions.   Will watch your condition.   Will get help right away if you are not doing well or get worse.  Document Released: 02/10/2008 Document Re-Released: 02/04/2009 [facility] Patient Information 2011 [place], [place].

## 2010-12-13 NOTE — Assessment & Plan Note (Signed)
At this point she is ready to try Amitiza. Would like to simplify her bowel regimen to see if meds are causing nausea. See patient instructions.

## 2010-12-13 NOTE — Progress Notes (Signed)
Primary Care Physician: Criss Rosales, MD  Primary Gastroenterologist:  Roetta Sessions, MD   Chief Complaint  Patient presents with  . Follow-up    not emptying bowels    HPI: Shirley Perry is a 35 y.o. female here for f/u of recent EGD done for chronic nausea. EGD showed patulous GE junction, small hh. Prior GES normal.  Still losing weight. Low-carb with protein diet. Weight down another 8 pounds in last three weeks. Chronic constipation. BM 10 times the other day. Hard to soft stool. Sometimes lot of stool. No melena, brbpr. Lots of gas at night, flatulence. Lactulose 2 tbsp twice a day. Sometimes take three stool softners with it. Intermittent increased stools for two weeks. But feels like not emptying bowels. Bad nausea every now and then (about every two weeks). Some nausea every day. Recent trial of Dexilant not any better. Went back on omeprazole.    Current Outpatient Prescriptions  Medication Sig Dispense Refill  . albuterol (PROVENTIL,VENTOLIN) 90 MCG/ACT inhaler Inhale 2 puffs into the lungs every 6 (six) hours as needed. asthma      . Cinnamon 500 MG TABS Take 2,000 mg by mouth 2 (two) times daily.        Marland Kitchen docusate sodium (COLACE) 100 MG capsule Take 300 mg by mouth daily.        Marland Kitchen ezetimibe-simvastatin (VYTORIN) 10-20 MG per tablet Take 1 tablet by mouth daily.        . Fiber, Guar Gum, CHEW Chew 2 tablets by mouth 2 (two) times daily.        Marland Kitchen lactulose (CHRONULAC) 10 GM/15ML solution Take 30 mLs (20 g total) by mouth 2 (two) times daily.  240 mL  5  . levothyroxine (SYNTHROID, LEVOTHROID) 50 MCG tablet Take 50 mcg by mouth daily.        Marland Kitchen LORazepam (ATIVAN) 1 MG tablet Take 1 mg by mouth 3 (three) times daily as needed. anxiety      . Lurasidone HCl (LATUDA) 40 MG TABS Take 40 mg by mouth daily.       . mometasone (ELOCON) 0.1 % cream Apply 1 application topically daily.       . Multiple Vitamins-Minerals (MULTIVITAL) tablet Take 1 tablet by mouth daily.          Marland Kitchen omeprazole (PRILOSEC) 40 MG capsule Take 1 capsule (40 mg total) by mouth daily. ONE BY MOUTH BEFORE BREAKFAST AND AT BEDTIME  31 capsule  11  . ondansetron (ZOFRAN-ODT) 4 MG disintegrating tablet Take 4 mg by mouth every 8 (eight) hours as needed. nausea      . pentosan polysulfate (ELMIRON) 100 MG capsule Take 100 mg by mouth 3 (three) times daily before meals.        . promethazine (PHENERGAN) 25 MG tablet Take 25 mg by mouth daily as needed.        . solifenacin (VESICARE) 10 MG tablet Take 10 mg by mouth daily.        Marland Kitchen lubiprostone (AMITIZA) 24 MCG capsule Take 1 capsule (24 mcg total) by mouth 2 (two) times daily with a meal. Use as needed for constipation.  20 capsule  0    Allergies as of 12/13/2010  . (No Known Allergies)    ROS:  General: Negative for anorexia, fever, chills, fatigue, weakness. ENT: Negative for hoarseness, difficulty swallowing , nasal congestion. CV: Negative for chest pain, angina, palpitations, dyspnea on exertion, peripheral edema.  Respiratory: Negative for dyspnea at rest, dyspnea  on exertion, cough, sputum, wheezing.  GI: See history of present illness. GU:  Negative for dysuria, hematuria, urinary incontinence, urinary frequency, nocturnal urination.  Endo: Negative for unusual weight change.    Physical Examination:   BP 115/74  Pulse 81  Temp(Src) 97.6 F (36.4 C) (Temporal)  Ht 5\' 5"  (1.651 m)  Wt 242 lb 3.2 oz (109.861 kg)  BMI 40.30 kg/m2  Vitals - 1 value per visit 12/13/2010 11/10/2010 11/08/2010 10/12/2010  Weight (lb) 242.2  250 249.2   Vitals - 1 value per visit 08/11/2010 07/05/2010 05/08/2010 04/28/2010 05/05/2009  Weight (lb) 267.2 287 280.5  280    General: Well-nourished, well-developed in no acute distress. Obese. Eyes: No icterus. Mouth: Oropharyngeal mucosa moist and pink , no lesions erythema or exudate. Lungs: Clear to auscultation bilaterally.  Heart: Regular rate and rhythm, no murmurs rubs or gallops.  Abdomen:  Bowel sounds are normal, nontender, nondistended, no hepatosplenomegaly or masses, no abdominal bruits or hernia , no rebound or guarding.   Extremities: No lower extremity edema. No clubbing or deformities. Neuro: Alert and oriented x 4   Skin: Warm and dry, no jaundice.   Psych: Alert and cooperative, normal mood and affect.

## 2010-12-13 NOTE — Assessment & Plan Note (Signed)
Hold unnecessary medications for two weeks. Change in bowel regimen as outlined. If bloating, gas, nausea continues, consider hydrogen breath test for small bowel bacterial overgrowth.

## 2010-12-18 NOTE — Progress Notes (Signed)
Cc to PCP 

## 2010-12-27 ENCOUNTER — Telehealth: Payer: Self-pay | Admitting: Gastroenterology

## 2010-12-27 NOTE — Telephone Encounter (Signed)
Pt called- the Amitiza didn't work, she is requesting refill on lactulose.

## 2010-12-27 NOTE — Telephone Encounter (Signed)
KJ already took care of this.

## 2011-08-31 ENCOUNTER — Other Ambulatory Visit: Payer: Self-pay | Admitting: Urgent Care

## 2011-11-23 ENCOUNTER — Telehealth: Payer: Self-pay

## 2011-11-23 MED ORDER — OMEPRAZOLE 40 MG PO CPDR: 40.0000 mg | DELAYED_RELEASE_CAPSULE | Freq: Every day | ORAL | Status: DC

## 2011-11-23 NOTE — Telephone Encounter (Signed)
I sent rx for omeprazole once daily to pharmacy but it is unclear if she was taking bid. Please check with patient.

## 2011-12-25 ENCOUNTER — Telehealth: Payer: Self-pay | Admitting: Internal Medicine

## 2011-12-25 MED ORDER — OMEPRAZOLE 40 MG PO CPDR: 40.0000 mg | DELAYED_RELEASE_CAPSULE | Freq: Every day | ORAL | Status: DC

## 2011-12-25 NOTE — Telephone Encounter (Signed)
Pt called to let us know that [facility] did not receive the prescription of Omeprazole. Can we resend it for her? Also, patient has made OV to see RMR on 9/10 @ 230

## 2011-12-25 NOTE — Telephone Encounter (Signed)
Resent

## 2011-12-31 ENCOUNTER — Encounter: Payer: Self-pay | Admitting: Internal Medicine

## 2012-01-01 ENCOUNTER — Encounter: Payer: Self-pay | Admitting: Internal Medicine

## 2012-01-01 ENCOUNTER — Ambulatory Visit (INDEPENDENT_AMBULATORY_CARE_PROVIDER_SITE_OTHER): Payer: Medicare Other | Admitting: Internal Medicine

## 2012-01-01 VITALS — BP 121/77 | HR 87 | Temp 97.6°F | Ht 65.0 in | Wt 245.8 lb

## 2012-01-01 DIAGNOSIS — K589 Irritable bowel syndrome without diarrhea: Secondary | ICD-10-CM

## 2012-01-01 DIAGNOSIS — K219 Gastro-esophageal reflux disease without esophagitis: Secondary | ICD-10-CM

## 2012-01-01 NOTE — Progress Notes (Signed)
Primary Care Physician:  Selinda Flavin, MD Primary Gastroenterologist:  Dr. Jena Gauss  Pre-Procedure History & Physical: HPI:  Shirley Perry is a 36 y.o. female here for followup of GERD intermittent nausea constipation alternating with occasional diarrhea. Did not like Amitiza - made her nauseated. Takes lactulose daily. Also takes a fiber supplement. Has 1-3 bowel movements daily. No blood per rectum. More constipated with amitriptyline. Lactulose seems to work well. Reflux symptoms well controlled on omeprazole 40 mg orally daily. No check of her thyroid recently but she is seeing Dr. Dimas Aguas in the very near future.  Past Medical History  Diagnosis Date  . Allergic rhinitis   . Asthma   . Depression   . Hypothyroidism   . Psoriasis   . Overactive bladder   . GERD (gastroesophageal reflux disease)   . Hiatal hernia   . Paranoia   . Chronic constipation     Past Surgical History  Procedure Date  . Cholecystectomy 1999  . Colonoscopy 2011    single anal papilla  . Esophagogastroduodenoscopy 2006    patulous EGJ, small hh  . Esophagogastroduodenoscopy 11/10/2010    normal esophagus/patulous EG junction/small HH    Prior to Admission medications   Medication Sig Start Date End Date Taking? Authorizing Provider  albuterol (PROVENTIL,VENTOLIN) 90 MCG/ACT inhaler Inhale 2 puffs into the lungs every 6 (six) hours as needed. asthma   Yes Historical Provider, MD  amitriptyline (ELAVIL) 10 MG tablet Take 10 mg by mouth at bedtime.   Yes Historical Provider, MD  cetirizine (ZYRTEC) 10 MG tablet Take 10 mg by mouth daily.   Yes Historical Provider, MD  Cinnamon 500 MG TABS Take 2,000 mg by mouth 2 (two) times daily.     Yes Historical Provider, MD  docusate sodium (COLACE) 100 MG capsule Take 300 mg by mouth daily.     Yes Historical Provider, MD  ezetimibe-simvastatin (VYTORIN) 10-20 MG per tablet Take 1 tablet by mouth daily.     Yes Historical Provider, MD  Fiber, Guar Gum, CHEW Chew 2  tablets by mouth 2 (two) times daily.     Yes Historical Provider, MD  lactulose (CHRONULAC) 10 GM/15ML solution TAKE 2 TABLESPOONFULS (30 MLS) BY MOUTH 2 TIMES DAILY. 08/31/11  Yes Nira Retort, NP  levothyroxine (SYNTHROID, LEVOTHROID) 50 MCG tablet Take 50 mcg by mouth daily.     Yes Historical Provider, MD  LORazepam (ATIVAN) 1 MG tablet Take 1 mg by mouth 3 (three) times daily as needed. anxiety   Yes Historical Provider, MD  lubiprostone (AMITIZA) 24 MCG capsule Take 1 capsule (24 mcg total) by mouth 2 (two) times daily with a meal. Use as needed for constipation. 12/13/10  Yes Tiffany Kocher, PA  Lurasidone HCl (LATUDA) 40 MG TABS Take 40 mg by mouth daily.    Yes Historical Provider, MD  mometasone (ELOCON) 0.1 % cream Apply 1 application topically daily.    Yes Historical Provider, MD  Multiple Vitamins-Minerals (MULTIVITAL) tablet Take 1 tablet by mouth daily.     Yes Historical Provider, MD  omeprazole (PRILOSEC) 40 MG capsule Take 1 capsule (40 mg total) by mouth daily. 12/25/11  Yes Nira Retort, NP  ondansetron (ZOFRAN-ODT) 4 MG disintegrating tablet Take 4 mg by mouth every 8 (eight) hours as needed. nausea   Yes Historical Provider, MD  pentosan polysulfate (ELMIRON) 100 MG capsule Take 100 mg by mouth 3 (three) times daily before meals.     Yes Historical Provider, MD  promethazine (  PHENERGAN) 25 MG tablet Take 25 mg by mouth daily as needed.     Yes Historical Provider, MD  solifenacin (VESICARE) 10 MG tablet Take 10 mg by mouth daily.     Yes Historical Provider, MD    Allergies as of 01/01/2012  . (No Known Allergies)    Family History  Problem Relation Age of Onset  . Ulcers Mother   . Breast cancer Mother   . Diabetes Father   . Anesthesia problems Neg Hx   . Hypotension Neg Hx   . Malignant hyperthermia Neg Hx   . Pseudochol deficiency Neg Hx     History   Social History  . Marital Status: Divorced    Spouse Name: N/A    Number of Children: 2  . Years of  Education: N/A   Occupational History  . unemployed    Social History Main Topics  . Smoking status: Never Smoker   . Smokeless tobacco: Never Used  . Alcohol Use: No  . Drug Use: No  . Sexually Active: No   Other Topics Concern  . Not on file   Social History Narrative  . No narrative on file    Review of Systems: See HPI, otherwise negative ROS  Physical Exam: BP 121/77  Pulse 87  Temp 97.6 F (36.4 C) (Temporal)  Ht 5\' 5"  (1.651 m)  Wt 245 lb 12.8 oz (111.494 kg)  BMI 40.90 kg/m2  LMP 12/25/2011 General:   Alert,  Well-developed, well-nourished, pleasant and cooperative in NAD Skin:  Intact without significant lesions or rashes. Eyes:  Sclera clear, no icterus.   Conjunctiva pink. Ears:  Normal auditory acuity. Nose:  No deformity, discharge,  or lesions. Mouth:  No deformity or lesions. Neck:  Supple; no masses or thyromegaly. No significant cervical adenopathy. Lungs:  Clear throughout to auscultation.   No wheezes, crackles, or rhonchi. No acute distress. Heart:  Regular rate and rhythm; no murmurs, clicks, rubs,  or gallops. Abdomen: Non-distended, normal bowel sounds.  Soft and nontender without appreciable mass or hepatosplenomegaly.  Pulses:  Normal pulses noted. Extremities:  Without clubbing or edema.  Impression/Plan:  GERD symptoms well controlled on omeprazole. CONSTIPATION AND DIARRHEA WITH A SLANT TOWARDS CONSTIPATION MORE RECENTLY-WELL MANAGED WITH FIBER SUPPLEMENT AND LACTULOSE. THIS SEEMS TO WORK FAIRLY WELL FOR THIS TIME.  Recommendations: Continue present regimen. Followup with Dr. Dimas Aguas as planned.. Office visit here in one year and when necessary.

## 2012-01-01 NOTE — Progress Notes (Signed)
Primary Care Physician:  Selinda Flavin, MD Primary Gastroenterologist:  Dr.   Pre-Procedure History & Physical: HPI:  Shirley Perry is a 36 y.o. female here for   Past Medical History  Diagnosis Date  . Allergic rhinitis   . Asthma   . Depression   . Hypothyroidism   . Psoriasis   . Overactive bladder   . GERD (gastroesophageal reflux disease)   . Hiatal hernia   . Paranoia   . Chronic constipation     Past Surgical History  Procedure Date  . Cholecystectomy 1999  . Colonoscopy 2011    single anal papilla  . Esophagogastroduodenoscopy 2006    patulous EGJ, small hh  . Esophagogastroduodenoscopy 11/10/2010    normal esophagus/patulous EG junction/small HH    Prior to Admission medications   Medication Sig Start Date End Date Taking? Authorizing Provider  albuterol (PROVENTIL,VENTOLIN) 90 MCG/ACT inhaler Inhale 2 puffs into the lungs every 6 (six) hours as needed. asthma   Yes Historical Provider, MD  amitriptyline (ELAVIL) 10 MG tablet Take 10 mg by mouth at bedtime.   Yes Historical Provider, MD  cetirizine (ZYRTEC) 10 MG tablet Take 10 mg by mouth daily.   Yes Historical Provider, MD  Cinnamon 500 MG TABS Take 2,000 mg by mouth 2 (two) times daily.     Yes Historical Provider, MD  docusate sodium (COLACE) 100 MG capsule Take 300 mg by mouth daily.     Yes Historical Provider, MD  ezetimibe-simvastatin (VYTORIN) 10-20 MG per tablet Take 1 tablet by mouth daily.     Yes Historical Provider, MD  Fiber, Guar Gum, CHEW Chew 2 tablets by mouth 2 (two) times daily.     Yes Historical Provider, MD  lactulose (CHRONULAC) 10 GM/15ML solution TAKE 2 TABLESPOONFULS (30 MLS) BY MOUTH 2 TIMES DAILY. 08/31/11  Yes Nira Retort, NP  levothyroxine (SYNTHROID, LEVOTHROID) 50 MCG tablet Take 50 mcg by mouth daily.     Yes Historical Provider, MD  LORazepam (ATIVAN) 1 MG tablet Take 1 mg by mouth 3 (three) times daily as needed. anxiety   Yes Historical Provider, MD  lubiprostone (AMITIZA) 24  MCG capsule Take 1 capsule (24 mcg total) by mouth 2 (two) times daily with a meal. Use as needed for constipation. 12/13/10  Yes Tiffany Kocher, PA  Lurasidone HCl (LATUDA) 40 MG TABS Take 40 mg by mouth daily.    Yes Historical Provider, MD  mometasone (ELOCON) 0.1 % cream Apply 1 application topically daily.    Yes Historical Provider, MD  Multiple Vitamins-Minerals (MULTIVITAL) tablet Take 1 tablet by mouth daily.     Yes Historical Provider, MD  omeprazole (PRILOSEC) 40 MG capsule Take 1 capsule (40 mg total) by mouth daily. 12/25/11  Yes Nira Retort, NP  ondansetron (ZOFRAN-ODT) 4 MG disintegrating tablet Take 4 mg by mouth every 8 (eight) hours as needed. nausea   Yes Historical Provider, MD  pentosan polysulfate (ELMIRON) 100 MG capsule Take 100 mg by mouth 3 (three) times daily before meals.     Yes Historical Provider, MD  promethazine (PHENERGAN) 25 MG tablet Take 25 mg by mouth daily as needed.     Yes Historical Provider, MD  solifenacin (VESICARE) 10 MG tablet Take 10 mg by mouth daily.     Yes Historical Provider, MD    Allergies as of 01/01/2012  . (No Known Allergies)    Family History  Problem Relation Age of Onset  . Ulcers Mother   .  Breast cancer Mother   . Diabetes Father   . Anesthesia problems Neg Hx   . Hypotension Neg Hx   . Malignant hyperthermia Neg Hx   . Pseudochol deficiency Neg Hx     History   Social History  . Marital Status: Divorced    Spouse Name: N/A    Number of Children: 2  . Years of Education: N/A   Occupational History  . unemployed    Social History Main Topics  . Smoking status: Never Smoker   . Smokeless tobacco: Never Used  . Alcohol Use: No  . Drug Use: No  . Sexually Active: No   Other Topics Concern  . Not on file   Social History Narrative  . No narrative on file    Review of Systems: See HPI, otherwise negative ROS  Physical Exam: BP 121/77  Pulse 87  Temp 97.6 F (36.4 C) (Temporal)  Ht 5\' 5"  (1.651 m)  Wt  245 lb 12.8 oz (111.494 kg)  BMI 40.90 kg/m2  LMP 12/25/2011 General:   Alert,  Well-developed, well-nourished, pleasant and cooperative in NAD Skin:  Intact without significant lesions or rashes. Eyes:  Sclera clear, no icterus.   Conjunctiva pink. Ears:  Normal auditory acuity. Nose:  No deformity, discharge,  or lesions. Mouth:  No deformity or lesions. Neck:  Supple; no masses or thyromegaly. No significant cervical adenopathy. Lungs:  Clear throughout to auscultation.   No wheezes, crackles, or rhonchi. No acute distress. Heart:  Regular rate and rhythm; no murmurs, clicks, rubs,  or gallops. Abdomen: Non-distended, normal bowel sounds.  Soft and nontender without appreciable mass or hepatosplenomegaly.  Pulses:  Normal pulses noted. Extremities:  Without clubbing or edema.  Impression/Plan:

## 2012-01-01 NOTE — Patient Instructions (Signed)
Continue fiber supplement  Continue lactulose  Continue omeprazole daily  Have thyroid checked by Dr. Dimas Aguas  Office visit here in 1 year

## 2012-01-03 ENCOUNTER — Encounter: Payer: Self-pay | Admitting: Internal Medicine

## 2012-01-08 ENCOUNTER — Telehealth: Payer: Self-pay

## 2012-01-08 MED ORDER — OMEPRAZOLE 20 MG PO CPDR: 20.0000 mg | DELAYED_RELEASE_CAPSULE | Freq: Two times a day (BID) | ORAL | Status: DC

## 2012-01-08 NOTE — Telephone Encounter (Signed)
Shirley Perry called to say the Rx for Omeprazole was sent in this time by Shirley Halls, Shirley Perry for qd and pt said she had been taking it bid. Please advise!  When calling the pharmacy back, the pt still goes by Shirley Perry there.

## 2012-01-08 NOTE — Telephone Encounter (Signed)
Called and spoke to Amy again. She said that pt did get 40 mg bid for a year, by Lorenza Burton, NP. It changed on 10/23/2011 when [facility] sent in the prescription. Please advise!

## 2012-01-08 NOTE — Telephone Encounter (Signed)
Looks like conflicting reports in chart (rx says daily but text says BID).  Since she was on 40 mg daily from what it looks like, I've changed dose to Prilosec 20 mg BID. This still gives her a total of 40 mg. Let's see how this does, taking it BID.

## 2012-01-09 MED ORDER — OMEPRAZOLE 40 MG PO CPDR: 40.0000 mg | DELAYED_RELEASE_CAPSULE | Freq: Two times a day (BID) | ORAL | Status: DC

## 2012-01-09 NOTE — Telephone Encounter (Signed)
Called and informed Amy at [facility].

## 2012-01-09 NOTE — Addendum Note (Signed)
Addended by: Nira Retort on: 01/09/2012 01:05 PM   Modules accepted: Orders

## 2012-01-09 NOTE — Telephone Encounter (Signed)
Ok. I have sent for BID.

## 2012-01-31 ENCOUNTER — Telehealth: Payer: Self-pay

## 2012-01-31 NOTE — Telephone Encounter (Signed)
Pt called- she is requesting that we call in something for IBS-C for her. She stated she had her thyroid checked and it was fine and needs something for IBS-C, she was on amitiza previously and had a lot of nausea. Pt stated her last BM today but has to strain a lot and doesn't feel like she ever empty's out completely. No blood in stool. Pt is requesting a different medication. Please advise.

## 2012-01-31 NOTE — Telephone Encounter (Signed)
Okay stop Amitiza; begin Linzess  290 mcg capsule 1 daily -  you can give her samples for 3 weeks.

## 2012-02-01 NOTE — Telephone Encounter (Signed)
Pt aware, samples at front desk 

## 2012-02-11 ENCOUNTER — Telehealth: Payer: Self-pay | Admitting: *Deleted

## 2012-02-11 MED ORDER — LINACLOTIDE 290 MCG PO CAPS: 290.0000 ug | ORAL_CAPSULE | Freq: Every day | ORAL | Status: DC

## 2012-02-11 NOTE — Telephone Encounter (Signed)
Routing to refill box  

## 2012-02-11 NOTE — Telephone Encounter (Signed)
Done

## 2012-02-11 NOTE — Addendum Note (Signed)
Addended by: Tiffany Kocher on: 02/11/2012 02:25 PM   Modules accepted: Orders

## 2012-02-11 NOTE — Telephone Encounter (Signed)
Shirley Perry/Shirley Perry called today. She is doing good with the linzess and would like a prescription called into her pharmacy. Please follow up. Thanks.

## 2012-02-12 ENCOUNTER — Telehealth: Payer: Self-pay | Admitting: Gastroenterology

## 2012-02-12 NOTE — Telephone Encounter (Signed)
PA paperwork is on AS desk.

## 2012-02-12 NOTE — Telephone Encounter (Signed)
Pt's insurance does not cover Linzess and is checking on status of prior aurthorization. Please call her at [phone]

## 2012-02-24 ENCOUNTER — Other Ambulatory Visit: Payer: Self-pay | Admitting: Gastroenterology

## 2012-02-25 ENCOUNTER — Telehealth: Payer: Self-pay | Admitting: *Deleted

## 2012-02-25 MED ORDER — LINACLOTIDE 290 MCG PO CAPS: 145.0000 ug | ORAL_CAPSULE | Freq: Every day | ORAL | Status: DC

## 2012-02-25 NOTE — Telephone Encounter (Signed)
Ms Shirley Perry called today. She would like to have her dosage of her linzess decreased. She is feelng nauseated from the medication. Please follow up.

## 2012-02-25 NOTE — Telephone Encounter (Signed)
Routing to refill box  

## 2012-02-25 NOTE — Telephone Encounter (Signed)
Pt is requesting linzess .

## 2012-02-25 NOTE — Telephone Encounter (Signed)
Completed.

## 2012-04-06 ENCOUNTER — Other Ambulatory Visit: Payer: Self-pay | Admitting: Gastroenterology

## 2012-05-03 ENCOUNTER — Other Ambulatory Visit: Payer: Self-pay | Admitting: Gastroenterology

## 2012-10-03 ENCOUNTER — Other Ambulatory Visit: Payer: Self-pay | Admitting: Gastroenterology

## 2012-10-03 MED ORDER — OMEPRAZOLE 40 MG PO CPDR: 40.0000 mg | DELAYED_RELEASE_CAPSULE | Freq: Two times a day (BID) | ORAL | Status: DC

## 2012-12-31 ENCOUNTER — Other Ambulatory Visit: Payer: Self-pay

## 2012-12-31 MED ORDER — HYDROCORTISONE 2.5 % RE CREA: TOPICAL_CREAM | Freq: Two times a day (BID) | RECTAL | Status: DC

## 2013-01-26 ENCOUNTER — Other Ambulatory Visit: Payer: Self-pay

## 2013-01-26 NOTE — Telephone Encounter (Signed)
Open in error

## 2013-01-27 MED ORDER — OMEPRAZOLE 40 MG PO CPDR: 40.0000 mg | DELAYED_RELEASE_CAPSULE | Freq: Two times a day (BID) | ORAL | Status: DC

## 2013-01-27 NOTE — Telephone Encounter (Signed)
Please find out from patient if she requires BID of 40mg . Should try to get by on once a day if tolerated.

## 2013-02-04 NOTE — Telephone Encounter (Signed)
She does take the 40 mg BID

## 2013-02-23 ENCOUNTER — Ambulatory Visit: Payer: Medicare Other | Admitting: Gastroenterology

## 2013-03-05 ENCOUNTER — Encounter (INDEPENDENT_AMBULATORY_CARE_PROVIDER_SITE_OTHER): Payer: Self-pay

## 2013-03-05 ENCOUNTER — Encounter: Payer: Self-pay | Admitting: Gastroenterology

## 2013-03-05 ENCOUNTER — Ambulatory Visit (INDEPENDENT_AMBULATORY_CARE_PROVIDER_SITE_OTHER): Payer: Medicare Other | Admitting: Gastroenterology

## 2013-03-05 VITALS — BP 124/72 | HR 87 | Temp 97.6°F | Wt 220.2 lb

## 2013-03-05 DIAGNOSIS — K219 Gastro-esophageal reflux disease without esophagitis: Secondary | ICD-10-CM

## 2013-03-05 DIAGNOSIS — K5909 Other constipation: Secondary | ICD-10-CM

## 2013-03-05 MED ORDER — PROMETHAZINE HCL 25 MG PO TABS: 25.0000 mg | ORAL_TABLET | Freq: Four times a day (QID) | ORAL | Status: DC | PRN

## 2013-03-05 MED ORDER — LINACLOTIDE 145 MCG PO CAPS: 145.0000 ug | ORAL_CAPSULE | Freq: Every day | ORAL | Status: DC

## 2013-03-05 NOTE — Progress Notes (Signed)
Primary Care Physician: Selinda Flavin, MD  Primary Gastroenterologist:  Roetta Sessions, MD   Chief Complaint  Patient presents with  . Medication Refill    HPI: INDIA JOLIN is a 37 y.o. female here for follow up of GERD, constipation. Last seen in 12/2011. Weight down 25 pounds in past one year. Down from 287 in 06/2010. No longer on elavil. Takes colace, Linzess for constipation. Nausea without vomiting. Does not require antiemetics routinely. No significant heartburn. BM fairly regularly right now. No melena, brbpr. No diarrhea.    Current Outpatient Prescriptions  Medication Sig Dispense Refill  . albuterol (PROVENTIL,VENTOLIN) 90 MCG/ACT inhaler Inhale 2 puffs into the lungs every 6 (six) hours as needed. asthma      . cetirizine (ZYRTEC) 10 MG tablet Take 10 mg by mouth daily.      . Cinnamon 500 MG TABS Take 2,000 mg by mouth 2 (two) times daily.        Marland Kitchen ezetimibe-simvastatin (VYTORIN) 10-20 MG per tablet Take 1 tablet by mouth daily.        Marland Kitchen lactulose (CHRONULAC) 10 GM/15ML solution TAKE 2 TABLESPOONFULS BY MOUTH 2 TIMES DAILY.  1864.408 mL  5  . levothyroxine (SYNTHROID, LEVOTHROID) 50 MCG tablet Take 50 mcg by mouth daily.        . Linaclotide (LINZESS) 290 MCG CAPS Take 145 mcg by mouth daily.  30 capsule  11  . LORazepam (ATIVAN) 1 MG tablet Take 1 mg by mouth 3 (three) times daily as needed. anxiety      . Lurasidone HCl (LATUDA) 40 MG TABS Take 40 mg by mouth daily.       . mometasone (ELOCON) 0.1 % cream Apply 1 application topically daily.       . Multiple Vitamins-Minerals (MULTIVITAL) tablet Take 1 tablet by mouth daily.        Marland Kitchen omeprazole (PRILOSEC) 40 MG capsule Take 1 capsule (40 mg total) by mouth 2 (two) times daily.  60 capsule  5  . ondansetron (ZOFRAN-ODT) 4 MG disintegrating tablet Take 4 mg by mouth every 8 (eight) hours as needed. nausea      . pentosan polysulfate (ELMIRON) 100 MG capsule Take 100 mg by mouth 3 (three) times daily before meals.         . promethazine (PHENERGAN) 25 MG tablet TAKE 1 TABLET DAILY AS NEEDED  30 tablet  1  . solifenacin (VESICARE) 10 MG tablet Take 10 mg by mouth daily.        . traZODone (DESYREL) 50 MG tablet Take 25 mg by mouth at bedtime.      Marland Kitchen amitriptyline (ELAVIL) 10 MG tablet Take 10 mg by mouth at bedtime.       No current facility-administered medications for this visit.    Allergies as of 03/05/2013  . (No Known Allergies)    ROS:  General: Negative for anorexia, weight loss, fever, chills, fatigue, weakness. ENT: Negative for hoarseness, difficulty swallowing , nasal congestion. CV: Negative for chest pain, angina, palpitations, dyspnea on exertion, peripheral edema.  Respiratory: Negative for dyspnea at rest, dyspnea on exertion, cough, sputum, wheezing.  GI: See history of present illness. GU:  Negative for dysuria, hematuria, urinary incontinence, urinary frequency, nocturnal urination.  Endo: Negative for unusual weight change.    Physical Examination:   BP 124/72  Pulse 87  Temp(Src) 97.6 F (36.4 C) (Oral)  Wt 220 lb 3.2 oz (99.882 kg)  LMP 02/11/2013  General: Well-nourished, well-developed in no acute distress.  Eyes: No icterus. Mouth: Oropharyngeal mucosa moist and pink , no lesions erythema or exudate. Lungs: Clear to auscultation bilaterally.  Heart: Regular rate and rhythm, no murmurs rubs or gallops.  Abdomen: Bowel sounds are normal, nontender, nondistended, no hepatosplenomegaly or masses, no abdominal bruits or hernia , no rebound or guarding.   Extremities: No lower extremity edema. No clubbing or deformities. Neuro: Alert and oriented x 4   Skin: Warm and dry, no jaundice.   Psych: Alert and cooperative, normal mood and affect.

## 2013-03-05 NOTE — Patient Instructions (Signed)
1. Continue to consume adequate water daily. Your goal should be 100 ounces daily.  2. Continue Linzess and colace.  3. Continue omeprazole but try to decrease to once daily before breakfast if tolerated. 4. Office visit in one year or sooner if needed.

## 2013-03-05 NOTE — Assessment & Plan Note (Signed)
Continue current regimen. Adequate fluid consumption.

## 2013-03-05 NOTE — Assessment & Plan Note (Signed)
Continue omeprazole but have encouraged a trial of once daily as tolerated.

## 2013-03-06 NOTE — Progress Notes (Signed)
Cc PCP 

## 2013-03-12 ENCOUNTER — Encounter: Payer: Self-pay | Admitting: Neurology

## 2013-03-13 ENCOUNTER — Telehealth: Payer: Self-pay | Admitting: General Practice

## 2013-03-13 NOTE — Telephone Encounter (Signed)
OPENED IN ERROR

## 2013-03-16 ENCOUNTER — Ambulatory Visit (INDEPENDENT_AMBULATORY_CARE_PROVIDER_SITE_OTHER): Payer: Medicare Other | Admitting: Neurology

## 2013-03-16 ENCOUNTER — Encounter: Payer: Self-pay | Admitting: Neurology

## 2013-03-16 VITALS — BP 103/71 | HR 74 | Temp 98.5°F | Ht 65.5 in | Wt 218.0 lb

## 2013-03-16 DIAGNOSIS — R209 Unspecified disturbances of skin sensation: Secondary | ICD-10-CM

## 2013-03-16 NOTE — Progress Notes (Signed)
Reason for visit: Numbness  Winnona Wargo is a 37 y.o. female  History of present illness:  Ms. Sheffield Slider is a 37 year old right-handed white female with a history of bipolar disorder, and a history of irritable bowel syndrome and interstitial cystitis. The patient has a one-month history of intermittent numbness sensations in the legs, feet, and arms. The patient indicates that the hands and the feet are primarily involved. The left foot is worse than the right, and the hands are equal to one another. Occasionally, the numbness sensations may go up to the hip level. The episodes occur daily, and are transient in nature associated with periods of normality between the episodes of symptoms. The patient denies any weakness of extremities, balance problems, or new problems controlling the bowels or the bladder. The patient denies any neck pain or low back pain or pain radiating down the extremities. The patient indicates that the symptoms are generally a bit worse with standing and walking, better with sitting or lying down. The patient is sent to this office for an evaluation. Thyroid studies and a B12 level were done recently. The patient has an Interstim stimulator in place, and she cannot undergo MRI evaluation.  Past Medical History  Diagnosis Date  . Allergic rhinitis   . Asthma   . Depression   . Hypothyroidism   . Psoriasis   . Overactive bladder   . GERD (gastroesophageal reflux disease)   . Hiatal hernia   . Paranoia   . Chronic constipation   . Obesity   . Interstitial cystitis   . Irritable bowel syndrome   . OSA on CPAP     Past Surgical History  Procedure Laterality Date  . Cholecystectomy  1999  . Colonoscopy  2011    single anal papilla  . Esophagogastroduodenoscopy  2006    patulous EGJ, small hh  . Esophagogastroduodenoscopy  11/10/2010    ZOX:WRUEAV esophagus/patulous EG junction/small HH  . Tubal ligation Bilateral     Family History  Problem Relation Age  of Onset  . Ulcers Mother   . Breast cancer Mother   . Diabetes Father   . Anesthesia problems Neg Hx   . Hypotension Neg Hx   . Malignant hyperthermia Neg Hx   . Pseudochol deficiency Neg Hx   . Sleep apnea Sister     Social history:  reports that she has never smoked. She has never used smokeless tobacco. She reports that she does not drink alcohol or use illicit drugs.  Medications:  Current Outpatient Prescriptions on File Prior to Visit  Medication Sig Dispense Refill  . albuterol (PROVENTIL,VENTOLIN) 90 MCG/ACT inhaler Inhale 2 puffs into the lungs every 6 (six) hours as needed. asthma      . levothyroxine (SYNTHROID, LEVOTHROID) 50 MCG tablet Take 50 mcg by mouth daily.        . Linaclotide (LINZESS) 145 MCG CAPS capsule Take 1 capsule (145 mcg total) by mouth daily.  30 capsule  11  . LORazepam (ATIVAN) 1 MG tablet Take 1 mg by mouth 3 (three) times daily as needed. anxiety      . Lurasidone HCl (LATUDA) 40 MG TABS Take 40 mg by mouth daily.       . mometasone (ELOCON) 0.1 % cream Apply 1 application topically daily.       . Multiple Vitamins-Minerals (MULTIVITAL) tablet Take 1 tablet by mouth daily.        Marland Kitchen omeprazole (PRILOSEC) 40 MG capsule Take 1 capsule (40  mg total) by mouth 2 (two) times daily.  60 capsule  5  . pentosan polysulfate (ELMIRON) 100 MG capsule Take 100 mg by mouth 3 (three) times daily before meals.        . promethazine (PHENERGAN) 25 MG tablet Take 1 tablet (25 mg total) by mouth every 6 (six) hours as needed for nausea or vomiting.  20 tablet  1  . solifenacin (VESICARE) 10 MG tablet Take 10 mg by mouth daily.        . traZODone (DESYREL) 50 MG tablet Take 25 mg by mouth at bedtime.       No current facility-administered medications on file prior to visit.     No Known Allergies  ROS:  Out of a complete 14 system review of symptoms, the patient complains only of the following symptoms, and all other reviewed systems are negative.  Fatigue    Diarrhea Urination problems, incontinence Feeling cold Achy muscles Headache, weakness Anxiety, decreased energy Insomnia  Blood pressure 103/71, pulse 74, temperature 98.5 F (36.9 C), temperature source Oral, height 5' 5.5" (1.664 m), weight 218 lb (98.884 kg), last menstrual period 02/11/2013.  Physical Exam  General: The patient is alert and cooperative at the time of the examination. The patient is markedly obese. The patient has a flat affect.  Head: Pupils are equal, round, and reactive to light. Discs are flat bilaterally.  Neck: The neck is supple, no carotid bruits are noted.  Respiratory: The respiratory examination is clear.  Cardiovascular: The cardiovascular examination reveals a regular rate and rhythm, no obvious murmurs or rubs are noted.  Neuromuscular: The patient has good range of movement of the cervical and lumbosacral spines.  Skin: Extremities are without significant edema.  Neurologic Exam  Mental status: the patient is alert and oriented x 3 at the time of the evaluation.   Cranial nerves: Facial symmetry is present. There is good sensation of the face to pinprick and soft touch bilaterally. The strength of the facial muscles and the muscles to head turning and shoulder shrug are normal bilaterally. Speech is well enunciated, no aphasia or dysarthria is noted. Extraocular movements are full. Visual fields are full.  Motor: The motor testing reveals 5 over 5 strength of all 4 extremities. Good symmetric motor tone is noted throughout.  Sensory: Sensory testing is intact to pinprick, soft touch, vibration sensation, and position sense on all 4 extremities. no stocking pattern pinprick sensory deficit is noted.  No evidence of extinction is noted.  Coordination: Cerebellar testing reveals good finger-nose-finger and heel-to-shin bilaterally.  Gait and station: Gait is normal. Tandem gait is normal. Romberg is negative. No drift is seen.  Reflexes:  Deep tendon reflexes are symmetric and normal bilaterally. Toes are downgoing bilaterally.   Assessment/Plan:  One. Intermittent numbness, all 4 extremities  2. Bipolar disorder  3. Interstitial cystitis  4. Irritable bowel syndrome  5. Obesity  6. Obstructive sleep apnea on CPAP  The patient has developed intermittent sensory symptoms involving all 4 extremities. The onset is not typical for a peripheral neuropathy. The patient may need to be evaluated for demyelinating disease, but intermittent symptoms generally indicate a more benign etiology. Blood work will be done today, and the patient will be set up for CT evaluation of the brain and the cervical spine. The patient cannot have MRI evaluation as she has an Librarian, academic in place. The patient will followup in 3-4 months.   Marlan Palau MD 03/16/2013 8:18 PM  [facility] [address] [place], [place] [address]  Phone [phone] Fax [phone]

## 2013-03-18 ENCOUNTER — Telehealth: Payer: Self-pay | Admitting: Neurology

## 2013-03-30 NOTE — Telephone Encounter (Signed)
F/U ON REFERRAL FOR CT SCAN AND LABWORK

## 2013-04-01 ENCOUNTER — Telehealth: Payer: Self-pay | Admitting: *Deleted

## 2013-04-01 DIAGNOSIS — R209 Unspecified disturbances of skin sensation: Secondary | ICD-10-CM

## 2013-04-01 NOTE — Telephone Encounter (Signed)
I called the patient. The patient wants to have the blood work done at [facility]. This should be okay, I will put in orders as "future" orders to be released. The only problem is that hours earlier for [facility], and [employer] uses [facility]. The lab numbers may not correlate.

## 2013-04-02 ENCOUNTER — Other Ambulatory Visit: Payer: Self-pay | Admitting: Neurology

## 2013-04-02 ENCOUNTER — Telehealth: Payer: Self-pay | Admitting: Neurology

## 2013-04-02 ENCOUNTER — Ambulatory Visit (HOSPITAL_COMMUNITY): Payer: Medicare Other

## 2013-04-02 ENCOUNTER — Ambulatory Visit (HOSPITAL_COMMUNITY)
Admission: RE | Admit: 2013-04-02 | Discharge: 2013-04-02 | Disposition: A | Discharge status: Home / Self Care | Payer: Medicare Other | Source: Ambulatory Visit | Attending: Neurology | Admitting: Neurology

## 2013-04-02 DIAGNOSIS — R51 Headache: Secondary | ICD-10-CM | POA: Diagnosis present

## 2013-04-02 DIAGNOSIS — M542 Cervicalgia: Secondary | ICD-10-CM | POA: Diagnosis not present

## 2013-04-02 DIAGNOSIS — R209 Unspecified disturbances of skin sensation: Secondary | ICD-10-CM | POA: Diagnosis not present

## 2013-04-02 LAB — RHEUMATOID FACTOR: Rhuematoid fact SerPl-aCnc: <10 IU/mL (ref <=14)

## 2013-04-02 MED ORDER — IOHEXOL 300 MG/ML  SOLN
75.0000 mL | Freq: Once | INTRAMUSCULAR | Status: AC | PRN
  Administered 2013-04-02: 75 mL via INTRAVENOUS

## 2013-04-02 NOTE — Telephone Encounter (Signed)
I called the patient. The CT scan of the brain and cervical spine was unremarkable. The patient has not yet gotten the blood work done, I will call her once this is available.

## 2013-04-02 NOTE — Telephone Encounter (Signed)
done

## 2013-04-02 NOTE — Telephone Encounter (Signed)
Aram Beecham from [facility] in [place] requesting a lab order. Please call Aram Beecham.

## 2013-04-03 ENCOUNTER — Telehealth: Payer: Self-pay | Admitting: Neurology

## 2013-04-03 LAB — RPR

## 2013-04-03 LAB — ANA: Anti Nuclear Antibody(ANA): POSITIVE — AB

## 2013-04-03 LAB — ANGIOTENSIN CONVERTING ENZYME: Angiotensin-Converting Enzyme: 22 U/L (ref 8–52)

## 2013-04-03 LAB — HIV ANTIBODY (ROUTINE TESTING W REFLEX): HIV: NONREACTIVE

## 2013-04-03 LAB — B. BURGDORFI ANTIBODIES: B burgdorferi Ab IgG+IgM: 0.63 {ISR}

## 2013-04-03 MED ORDER — MILNACIPRAN HCL 12.5 MG PO TABS: ORAL_TABLET | ORAL | Status: DC

## 2013-04-03 NOTE — Telephone Encounter (Signed)
I called patient. The CT scan evaluations and the blood work are unremarkable. Low titer elevation of the ANA. Not sure that this is clinically significant. The patient complains of migratory pain throughout the body, migratory sensory complaints. The patient could have fibromyalgia. The patient indicates that she cannot take Cymbalta, we will try Savella.

## 2013-04-08 LAB — COPPER, SERUM: Copper: 87 ug/dL (ref 70–175)

## 2013-04-21 ENCOUNTER — Telehealth: Payer: Self-pay | Admitting: Neurology

## 2013-04-21 MED ORDER — PREGABALIN 50 MG PO CAPS: ORAL_CAPSULE | ORAL | Status: DC

## 2013-04-21 NOTE — Telephone Encounter (Signed)
I called again, once again, the patient did not answer, unable to leave a message.

## 2013-04-21 NOTE — Telephone Encounter (Signed)
Patient was prescribed Savella 12.5 mg. She started last week and is complaining of nausea. She wants to know if this medication can be changed.

## 2013-04-21 NOTE — Telephone Encounter (Signed)
I called patient. The patient is unable to tolerate Savella. I will try low-dose Lyrica.

## 2013-04-21 NOTE — Telephone Encounter (Signed)
I called patient. The patient did not answer the phone, unable to leave a message. I will call back later.

## 2013-04-21 NOTE — Telephone Encounter (Signed)
Patient would like to speak with Dr. Anne Hahn about trying a different medication. Please call back to advise.

## 2013-05-25 ENCOUNTER — Telehealth: Payer: Self-pay

## 2013-05-25 NOTE — Telephone Encounter (Signed)
Pt left VM that she needed a PA for Linzess. I tried to call her and Vm not set up.

## 2013-05-26 NOTE — Telephone Encounter (Signed)
Spoke with pt- linzess has already been approved for this pt in November 2014 and is good for 1 year.  Pt stated she may have gotten her medicines mixed up and now she thinks she needs a PA for omeprazole 40mg  bid. Informed pt that I would work on that for her. She still has the same insurance.

## 2013-06-04 NOTE — Telephone Encounter (Signed)
PA for omeprazole bid has been approved and letter has been faxed to the pharmacy.

## 2013-07-14 ENCOUNTER — Telehealth: Payer: Self-pay | Admitting: Internal Medicine

## 2013-07-14 NOTE — Telephone Encounter (Signed)
Pt called asking to speak with nurse. I told her nurse was not available and she could leave a message and number for her. Its regarding her medicines and you can reach her at [phone]

## 2013-07-15 ENCOUNTER — Telehealth: Payer: Self-pay | Admitting: Internal Medicine

## 2013-07-15 NOTE — Telephone Encounter (Signed)
Spoke with pt about her prior authorization forms. They have been completed and faxed to the company.

## 2013-07-15 NOTE — Telephone Encounter (Signed)
Spoke with pt. She had questions about her prior authorization forms.

## 2013-07-15 NOTE — Telephone Encounter (Signed)
Pt has called again today asking to speak with the nurse. Nurse is not available. I told her that the nurse is aware of her call from yesterday and she will call her back.

## 2013-07-16 ENCOUNTER — Other Ambulatory Visit: Payer: Self-pay | Admitting: Gastroenterology

## 2013-08-05 ENCOUNTER — Encounter: Payer: Self-pay | Admitting: Neurology

## 2013-08-16 ENCOUNTER — Other Ambulatory Visit: Payer: Self-pay | Admitting: Gastroenterology

## 2013-08-25 ENCOUNTER — Other Ambulatory Visit: Payer: Self-pay | Admitting: Gastroenterology

## 2013-09-22 ENCOUNTER — Ambulatory Visit: Payer: Medicare Other | Admitting: Neurology

## 2013-09-30 ENCOUNTER — Ambulatory Visit: Payer: Medicare Other | Admitting: Neurology

## 2013-10-13 ENCOUNTER — Other Ambulatory Visit: Payer: Self-pay | Admitting: Gastroenterology

## 2013-11-11 ENCOUNTER — Telehealth: Payer: Self-pay

## 2013-11-11 NOTE — Telephone Encounter (Signed)
Pt called and would like a call from Dr. Luvenia Starchourk's nurse in reference to a medication change. She can be reached at [phone].

## 2013-11-12 MED ORDER — LINACLOTIDE 290 MCG PO CAPS: 290.0000 ug | ORAL_CAPSULE | Freq: Every day | ORAL | Status: DC

## 2013-11-12 NOTE — Telephone Encounter (Signed)
done

## 2013-11-12 NOTE — Telephone Encounter (Signed)
Tried to call pt- LM 

## 2013-11-12 NOTE — Telephone Encounter (Signed)
Pt currently taking linzess . She is having some problems with constipation and would like to increase to .

## 2014-01-04 ENCOUNTER — Encounter (INDEPENDENT_AMBULATORY_CARE_PROVIDER_SITE_OTHER): Payer: Self-pay

## 2014-01-04 ENCOUNTER — Encounter: Payer: Self-pay | Admitting: Neurology

## 2014-01-04 ENCOUNTER — Ambulatory Visit (INDEPENDENT_AMBULATORY_CARE_PROVIDER_SITE_OTHER): Payer: Medicare Other | Admitting: Neurology

## 2014-01-04 VITALS — BP 115/77 | HR 86 | Ht 65.0 in | Wt 194.0 lb

## 2014-01-04 DIAGNOSIS — R259 Unspecified abnormal involuntary movements: Secondary | ICD-10-CM

## 2014-01-04 DIAGNOSIS — R253 Fasciculation: Secondary | ICD-10-CM

## 2014-01-04 NOTE — Progress Notes (Signed)
Reason for visit: Muscle fasciculations  Shirley Perry is a 38 y.o. female  History of present illness:  Shirley Perry is a 38 year old right-handed white female with a history of irritable bowel syndrome and interstitial cystitis. The patient reports onset of muscle twitches that began in June of 2015. The patient is somewhat vague concerning her history, but it appears that the muscle twitches started in the eyelid, and may be seen in the legs or arms intermittently. The patient indicates that the muscle twitches are not painful, and are not associated with muscle cramps, and are not associated with weakness. The patient was seen through this office one year ago for numbness in the arms and legs. This issue apparently has gone away. The patient indicates that the muscle twitches may occur very occasionally, the last episode occurred one week ago. The patient reports some issues controlling the bowels and bladder with her history of irritable bowel and interstitial cystitis. The patient denies any falls. She is concerned about the muscle twitches, and she comes to our office today for an evaluation.   Past Medical History  Diagnosis Date  . Allergic rhinitis   . Asthma   . Depression   . Hypothyroidism   . Psoriasis   . Overactive bladder   . GERD (gastroesophageal reflux disease)   . Hiatal hernia   . Paranoia   . Chronic constipation   . Obesity   . Interstitial cystitis   . Irritable bowel syndrome   . OSA on CPAP     Past Surgical History  Procedure Laterality Date  . Cholecystectomy  1999  . Colonoscopy  2011    single anal papilla  . Esophagogastroduodenoscopy  2006    patulous EGJ, small hh  . Esophagogastroduodenoscopy  11/10/2010    ZOX:WRUEAV esophagus/patulous EG junction/small HH  . Tubal ligation Bilateral   . Bladder repair      Family History  Problem Relation Age of Onset  . Ulcers Mother   . Breast cancer Mother   . Diabetes Father   . Anesthesia  problems Neg Hx   . Hypotension Neg Hx   . Malignant hyperthermia Neg Hx   . Pseudochol deficiency Neg Hx   . Sleep apnea Sister     Social history:  reports that she has never smoked. She has never used smokeless tobacco. She reports that she does not drink alcohol or use illicit drugs.  Medications:  Current Outpatient Prescriptions on File Prior to Visit  Medication Sig Dispense Refill  . albuterol (PROVENTIL,VENTOLIN) 90 MCG/ACT inhaler Inhale 2 puffs into the lungs every 6 (six) hours as needed. asthma      . ibuprofen (ADVIL,MOTRIN) 800 MG tablet as needed.      . lactulose (CHRONULAC) 10 GM/15ML solution TAKE 2 TABLESPOONFULS BY MOUTH 2 TIMES DAILY.  1920 mL  5  . levothyroxine (SYNTHROID, LEVOTHROID) 50 MCG tablet Take 50 mcg by mouth daily.        . Linaclotide (LINZESS) 290 MCG CAPS capsule Take 1 capsule (290 mcg total) by mouth daily.  30 capsule  11  . LORazepam (ATIVAN) 1 MG tablet Take 1 mg by mouth 2 (two) times daily. anxiety      . Lurasidone HCl (LATUDA) 40 MG TABS Take 40 mg by mouth daily.       . Multiple Vitamins-Minerals (MULTIVITAL) tablet Take 1 tablet by mouth daily.        Marland Kitchen omeprazole (PRILOSEC) 40 MG capsule TAKE 1 CAPSULE BY  MOUTH TWICE DAILY  60 capsule  5  . pentosan polysulfate (ELMIRON) 100 MG capsule Take 100 mg by mouth 3 (three) times daily before meals.        Marland Kitchen PROCTOZONE-HC 2.5 % rectal cream       . promethazine (PHENERGAN) 25 MG tablet TAKE 1 TABLET BY MOUTH EVERY SIX HOURS AS NEEDED FOR NAUSEA OR VOMITING  20 tablet  1  . traZODone (DESYREL) 50 MG tablet Take 25 mg by mouth at bedtime.       No current facility-administered medications on file prior to visit.      Allergies  Allergen Reactions  . Savella [Milnacipran Hcl]     Nausea    ROS:  Out of a complete 14 system review of symptoms, the patient complains only of the following symptoms, and all other reviewed systems are negative.  Fatigue Eye pain Shortness of  breath Urination problems, incontinence Tremor Depression, racing thoughts Insomnia, sleepiness  Blood pressure 115/77, pulse 86, height  (1.651 m), weight 194 lb (87.998 kg).  Physical Exam  General: The patient is alert and cooperative at the time of the examination.The patient is moderately obese.   Eyes: Pupils are equal, round, and reactive to light. Discs are flat bilaterally.  Neck: The neck is supple, no carotid bruits are noted.  Respiratory: The respiratory examination is clear.  Cardiovascular: The cardiovascular examination reveals a regular rate and rhythm, no obvious murmurs or rubs are noted.  Skin: Extremities are without significant edema.  Neurologic Exam  Mental status: The patient is alert and oriented x 3 at the time of the examination. The patient has apparent normal recent and remote memory, with an apparently normal attention span and concentration ability.  Cranial nerves: Facial symmetry is present. There is good sensation of the face to pinprick and soft touch bilaterally. The strength of the facial muscles and the muscles to head turning and shoulder shrug are normal bilaterally. Speech is well enunciated, no aphasia or dysarthria is noted. Extraocular movements are full. Visual fields are full. The tongue is midline, and the patient has symmetric elevation of the soft palate. No obvious hearing deficits are noted.  Motor: The motor testing reveals 5 over 5 strength of all 4 extremities. Good symmetric motor tone is noted throughout.  Sensory: Sensory testing is intact to pinprick, soft touch, vibration sensation, and position sense on all 4 extremities. No evidence of extinction is noted.  Coordination: Cerebellar testing reveals good finger-nose-finger and heel-to-shin bilaterally.  Gait and station: Gait is normal. Tandem gait is normal. Romberg is negative. No drift is seen.  Reflexes: Deep tendon reflexes are symmetric and normal bilaterally.  Toes are downgoing bilaterally.   Assessment/Plan:  One. Benign muscle fasciculations  The patient has an unremarkable examination today, she reports occasional intermittent muscle fasciculations unassociated with muscle cramps. No further workup is indicated at this time. If the fasciculations become extremely prominent, she is to contact our office. The patient denies any excessive caffeine intake, but she is not sleeping well, and this may be the underlying etiology for these fasciculations.   Marlan Palau MD 01/04/2014 7:20 PM  [facility] [address] [place], [place] [address]  Phone [phone] Fax [phone]

## 2014-01-21 ENCOUNTER — Telehealth: Payer: Self-pay | Admitting: Internal Medicine

## 2014-01-21 NOTE — Telephone Encounter (Signed)
Spoke with the pt. She has had a change in her medications and would like to have her linzess 290mcg decreased to linzess 145mcg. Pt uses [facility].

## 2014-01-21 NOTE — Telephone Encounter (Signed)
Pt called to speak with JL. She would not give me her full name and I had to look it up by phone number. I asked to take a message for JL, but patient refused and wanted JL voicemail. I told patient that I seen [facility] and would she like to go ahead and schedule OV while I had her on the phone. She is aware of OV on 11/4 at 130 with [facility] and then [facility]

## 2014-01-22 MED ORDER — LINACLOTIDE 145 MCG PO CAPS: 145.0000 ug | ORAL_CAPSULE | Freq: Every day | ORAL | Status: DC

## 2014-01-22 NOTE — Addendum Note (Signed)
Addended by: Nira RetortSAMS, Quayshaun Hubbert W on: 01/22/2014 10:30 AM   Modules accepted: Orders, Medications

## 2014-01-22 NOTE — Telephone Encounter (Signed)
Completed.

## 2014-02-19 ENCOUNTER — Telehealth: Payer: Self-pay

## 2014-02-19 NOTE — Telephone Encounter (Signed)
FYI- Pt called- she said the linzess is not working well for her and she wants to know if we can increase it back to linzess . I checked with the pharmacy and they still have the linzess on file for the pt. I called and informed pt.

## 2014-02-19 NOTE — Telephone Encounter (Signed)
noted 

## 2014-02-24 ENCOUNTER — Encounter: Payer: Self-pay | Admitting: Gastroenterology

## 2014-02-24 ENCOUNTER — Ambulatory Visit (INDEPENDENT_AMBULATORY_CARE_PROVIDER_SITE_OTHER): Payer: Medicare Other | Admitting: Gastroenterology

## 2014-02-24 VITALS — BP 108/75 | HR 88 | Temp 99.2°F | Ht 65.0 in | Wt 186.0 lb

## 2014-02-24 DIAGNOSIS — K219 Gastro-esophageal reflux disease without esophagitis: Secondary | ICD-10-CM

## 2014-02-24 DIAGNOSIS — R634 Abnormal weight loss: Secondary | ICD-10-CM

## 2014-02-24 DIAGNOSIS — K5909 Other constipation: Secondary | ICD-10-CM

## 2014-02-24 MED ORDER — OMEPRAZOLE 40 MG PO CPDR: DELAYED_RELEASE_CAPSULE | ORAL | Status: DC

## 2014-02-24 NOTE — Progress Notes (Signed)
Primary Care Physician: Selinda FlavinHOWARD, KEVIN, MD  Primary Gastroenterologist:  Roetta SessionsMichael Rourk, MD   Chief Complaint  Patient presents with  . Follow-up    HPI: Shirley Perry is a 38 y.o. female here for one year follow up of GERD, constipation. Last seen in 02/2013. Weight down additional 35 pounds. Down between 80 and 90 pounds all together. Intentional weight loss per patient. States her GERD is well-controlled on omeprazole bid. Phenergan on occasion for nausea. Recently had to increase to Linzess 290mcg daily from 145mcg. Doing better. No melena. Rare brbpr. Colonoscopy up to date, 2011. No FH of CRC.  Current Outpatient Prescriptions  Medication Sig Dispense Refill  . albuterol (PROVENTIL,VENTOLIN) 90 MCG/ACT inhaler Inhale 2 puffs into the lungs every 6 (six) hours as needed. asthma    . amitriptyline (ELAVIL) 75 MG tablet Take 75 mg by mouth at bedtime.    Marland Kitchen. ibuprofen (ADVIL,MOTRIN) 800 MG tablet as needed.    . lactulose (CHRONULAC) 10 GM/15ML solution TAKE 2 TABLESPOONFULS BY MOUTH 2 TIMES DAILY. 1920 mL 5  . levothyroxine (SYNTHROID, LEVOTHROID) 50 MCG tablet Take 50 mcg by mouth daily.      . Linaclotide (LINZESS) 290 MCG CAPS capsule Take 290 mcg by mouth daily.    Marland Kitchen. LORazepam (ATIVAN) 1 MG tablet Take 1 mg by mouth 2 (two) times daily. anxiety    . Lurasidone HCl (LATUDA) 40 MG TABS Take 40 mg by mouth daily.     . Multiple Vitamins-Minerals (MULTIVITAL) tablet Take 1 tablet by mouth daily.      Marland Kitchen. omeprazole (PRILOSEC) 40 MG capsule TAKE 1 CAPSULE BY MOUTH TWICE DAILY 60 capsule 5  . oxybutynin (DITROPAN XL) 15 MG 24 hr tablet Take 15 mg by mouth 2 (two) times daily.    . pentosan polysulfate (ELMIRON) 100 MG capsule Take 100 mg by mouth 3 (three) times daily before meals.      Marland Kitchen. PROCTOZONE-HC 2.5 % rectal cream     . promethazine (PHENERGAN) 25 MG tablet TAKE 1 TABLET BY MOUTH EVERY SIX HOURS AS NEEDED FOR NAUSEA OR VOMITING 20 tablet 1  . sertraline (ZOLOFT) 25 MG  tablet Take 25 mg by mouth daily.     No current facility-administered medications for this visit.    Allergies as of 02/24/2014  . (No Active Allergies)    ROS:  General: Negative for anorexia,   fever, chills, fatigue, weakness.see hpi ENT: Negative for hoarseness, difficulty swallowing , nasal congestion. CV: Negative for chest pain, angina, palpitations, dyspnea on exertion, peripheral edema.  Respiratory: Negative for dyspnea at rest, dyspnea on exertion, cough, sputum, wheezing.  GI: See history of present illness. GU:  Negative for dysuria, hematuria, urinary incontinence, urinary frequency, nocturnal urination.  Endo: see hpi.    Physical Examination:   BP 108/75 mmHg  Pulse 88  Temp(Src) 99.2 F (37.3 C) (Oral)  Ht 5\' 5"  (1.651 m)  Wt 186 lb (84.369 kg)  BMI 30.95 kg/m2  General: Well-nourished, well-developed in no acute distress.  Eyes: No icterus. Mouth: Oropharyngeal mucosa moist and pink , no lesions erythema or exudate. Lungs: Clear to auscultation bilaterally.  Heart: Regular rate and rhythm, no murmurs rubs or gallops.  Abdomen: Bowel sounds are normal, nontender, nondistended, no hepatosplenomegaly or masses, no abdominal bruits or hernia , no rebound or guarding.   Extremities: No lower extremity edema. No clubbing or deformities. Neuro: Alert and oriented x 4   Skin: Warm and dry, no jaundice.  Psych: Alert and cooperative, normal mood and affect.   Imaging Studies: No results found.

## 2014-02-24 NOTE — Assessment & Plan Note (Addendum)
Continue Linzess daily. OV in 6 months.

## 2014-02-24 NOTE — Assessment & Plan Note (Signed)
Continued weight loss efforts. Patient wants to reach 150lb. She will keep us posted if her weight seems to be dropping without significant effort, which would be concerning.

## 2014-02-24 NOTE — Patient Instructions (Signed)
1. Continue to monitor your weight loss. You you continue to lose weight without really trying, you need to let us know. 2. If you have any change in your GI symptoms, let me know. Otherwise, continue Linzess and omeprazole (new RX sent in today). 3. Office visit in 6 months.

## 2014-02-24 NOTE — Assessment & Plan Note (Signed)
Continue omeprazole bid, failed once daily. Continue antireflux measures. Hopefully gerd will improved with ongoing weight loss.

## 2014-02-24 NOTE — Progress Notes (Signed)
cc'ed to pcp °

## 2014-05-06 ENCOUNTER — Telehealth: Payer: Self-pay

## 2014-05-06 NOTE — Telephone Encounter (Signed)
Pt called and has 2 different names in the computer... She states she may need a pre-cert from medicaid for the banding.  Pt states she wanted to make us aware.

## 2014-05-06 NOTE — Telephone Encounter (Signed)
Pt called and left message on my voicemail.  Unknown reason of call. It was not specified on message.  Called pt back and LMOM to call back

## 2014-05-17 ENCOUNTER — Encounter: Payer: Self-pay | Admitting: Internal Medicine

## 2014-06-04 ENCOUNTER — Ambulatory Visit: Payer: Medicaid Other | Admitting: Internal Medicine

## 2014-06-08 ENCOUNTER — Ambulatory Visit (INDEPENDENT_AMBULATORY_CARE_PROVIDER_SITE_OTHER): Payer: Medicare Other | Admitting: Internal Medicine

## 2014-06-08 ENCOUNTER — Encounter: Payer: Self-pay | Admitting: Internal Medicine

## 2014-06-08 VITALS — BP 113/74 | HR 86 | Temp 98.0°F | Ht 65.0 in | Wt 172.2 lb

## 2014-06-08 DIAGNOSIS — K219 Gastro-esophageal reflux disease without esophagitis: Secondary | ICD-10-CM

## 2014-06-08 DIAGNOSIS — K5909 Other constipation: Secondary | ICD-10-CM | POA: Diagnosis not present

## 2014-06-08 DIAGNOSIS — K648 Other hemorrhoids: Secondary | ICD-10-CM

## 2014-06-08 NOTE — Patient Instructions (Signed)
Avoid straining.  Take fiber supplement every day  Limit toilet time to no more than 5 minutes  Stop Linzess.; begin Amitiza 24 mcg gel cap twice daily-taken during meals to avoid nausea  Consider using an over-the-counter probiotic specifically indicated for gas bloat and constipation  Continue omeprazole 40 mg twice daily  Call with any interim problems  Schedule followup appointment in 3 weeks from now

## 2014-06-08 NOTE — Progress Notes (Signed)
[facility] banding procedure note:  Patient complains of symptomatic hemorrhoids. Describes grade 3 hemorrhoids. Has bleeding, pressure, itching, pain burning and Soilage from time to time. Takes fiber supplementation only sporadically. States Linzess is not working that well for her constipation at the dose of 290 daily. Has tried ProctoFoam and other topical agents without much improvement in symptoms. Had a thorough colonoscopy 2011 with only anal papilla being found.  Patient desires hemorrhoid banding.  Wants to try Amitiza in lieu of Linzess. Takes probiotic only sporadically.  GERD symptoms well controlled on omeprazole 40 mg twice daily. Wants to continue. Needs prior authorization.  Abdomen is soft and nontender without appreciable mass or organomegaly In the left lateral decubitus position, a digital rectal exam was performed. No external lesions seen. Digital exam normal aside from a somewhat tight anal sphincter. Subsequently, a pea-sized amount of nitroglycerin 0.125% ointment applied.  The decision was made to band the left lateral internal hemorrhoid;  Risks, benefits, and alternative forms of therapy were described and informed consent was obtained.  [facility] was used to perform band ligation without complication. Digital anorectal examination was then performed to assure proper positioning of the band; band found to be in excellent position. No pinching or pain. The patient was discharged home without pain or other issues. Dietary and behavioral recommendations were given.   given), along with follow-up instructions. The patient will return in 3 weeks for followup and possible additional banding as required.  Patient will also continue omeprazole 40 mg orally twice daily.  No complications were encountered and the patient tolerated the procedure well.

## 2014-06-15 ENCOUNTER — Telehealth: Payer: Self-pay | Admitting: Internal Medicine

## 2014-06-15 NOTE — Telephone Encounter (Signed)
Per RMR it's okay for the patient to stay on 08/06/14.

## 2014-06-15 NOTE — Telephone Encounter (Signed)
CORRECTION, PATIENT IS COMING 07/27/14

## 2014-06-15 NOTE — Telephone Encounter (Signed)
YOU HAD WANTED PATIENT TO RETURN IN 2 WEEKS FOR F/U BANDING,  SHE IS ON THE SCHEDULE FOR 08/06/14.  IS THAT OK?  PLEASE ADVISE

## 2014-06-16 NOTE — Telephone Encounter (Signed)
10-4

## 2014-07-19 ENCOUNTER — Telehealth: Payer: Self-pay

## 2014-07-19 NOTE — Telephone Encounter (Signed)
LMOM

## 2014-07-19 NOTE — Telephone Encounter (Signed)
Pt stated that she feels the band sit in her rectum. Please advise

## 2014-07-19 NOTE — Telephone Encounter (Signed)
PLEASE CALL PT. HER BAND FELL OFF IN FEB 2016. IF SHE IS HAVING BLADDER PROBLEMS SHE SHOULD SEE HER PCP.

## 2014-07-19 NOTE — Telephone Encounter (Signed)
Pt is calling because she would like to have the band taken off her hemorrhoid because she thinks it is making her bladder worst. I told her that RMR is out of town and that I would ask SL[facility]. Please advise

## 2014-07-20 NOTE — Telephone Encounter (Signed)
PLEASE CALL PT. THE BAND SHOULD FALL OFF. IF WE TAKE IT OFF IT COULD BLEED. SHE SHOULD CALL DR. Jena GaussOURK ON MON APR 3 IF SHE THINK THE BAND IS STILL THERE.

## 2014-07-20 NOTE — Telephone Encounter (Signed)
LMOM  To call back

## 2014-07-21 NOTE — Telephone Encounter (Signed)
Pt called back. She would like to know what RMR thinks because she stated that she still feels something like a band. She thinks that the band was not tight enough for the hemorrhoid to fall off to start with. I told her that RMR was out of town and he would be back next week and she was fine with that. Please advise

## 2014-07-27 ENCOUNTER — Ambulatory Visit: Payer: Medicare Other | Admitting: Internal Medicine

## 2014-07-27 NOTE — Telephone Encounter (Signed)
Called pt and LMOM.  

## 2014-07-27 NOTE — Telephone Encounter (Signed)
Her symptoms should resolve without further intervention. Can we touch base with her this week and see how she's doing. I would not necessarily recommend anything else other than followup here as scheduled

## 2014-07-29 ENCOUNTER — Telehealth: Payer: Self-pay | Admitting: Internal Medicine

## 2014-07-29 NOTE — Telephone Encounter (Signed)
Just received request for PA today. Working on paperwork.

## 2014-07-29 NOTE — Telephone Encounter (Signed)
PATIENT NEEDS PRIOR AUTH ON LINZESS FOR HER REFILL.  [id number]

## 2014-08-02 NOTE — Telephone Encounter (Signed)
Letter mailed

## 2014-09-06 ENCOUNTER — Telehealth: Payer: Self-pay | Admitting: Internal Medicine

## 2014-09-06 NOTE — Telephone Encounter (Signed)
[phone]  PLEASE CALL PATIENT   SHE IS REQUESTING A DECREASE IN HER LINZESS TO TO HER THYROID MEDS

## 2014-09-06 NOTE — Telephone Encounter (Signed)
Tried to call pt- LMOM 

## 2014-09-07 NOTE — Telephone Encounter (Signed)
Spoke with the pt- she is requesting that we decrease her linzess to 145mcg because her pcp increased her dose of synthroid and now the 290mcg is too strong. Pt uses [facility].

## 2014-09-08 ENCOUNTER — Telehealth: Payer: Self-pay | Admitting: Internal Medicine

## 2014-09-08 NOTE — Telephone Encounter (Signed)
Note has been sent to the refill box already.

## 2014-09-08 NOTE — Telephone Encounter (Signed)
Patient needs linzess lowered  Please call

## 2014-09-10 ENCOUNTER — Ambulatory Visit: Payer: Medicare Other | Admitting: Internal Medicine

## 2014-09-10 MED ORDER — LINACLOTIDE 145 MCG PO CAPS
145.0000 ug | ORAL_CAPSULE | Freq: Every day | ORAL | Status: DC
Start: 2014-09-10 — End: 2015-02-15

## 2014-09-10 NOTE — Telephone Encounter (Signed)
Completed.

## 2014-09-10 NOTE — Addendum Note (Signed)
Addended by: Nira RetortSAMS, Song Garris W on: 09/10/2014 11:12 AM   Modules accepted: Orders, Medications

## 2014-10-11 ENCOUNTER — Other Ambulatory Visit: Payer: Self-pay | Admitting: Gastroenterology

## 2014-11-04 ENCOUNTER — Telehealth: Payer: Self-pay

## 2014-11-04 NOTE — Telephone Encounter (Signed)
Tried to do a PA for promethazine. It has been denied because the pt has not tried promethazine hcl syrup 62.25/565ml. Do you want to change to this or do an appeal letter?

## 2014-11-05 MED ORDER — PROMETHAZINE HCL 6.25 MG/5ML PO SYRP: 12.5000 mg | ORAL_SOLUTION | Freq: Four times a day (QID) | ORAL | Status: DC | PRN

## 2014-11-05 NOTE — Telephone Encounter (Signed)
RX for liquid done.

## 2014-11-05 NOTE — Telephone Encounter (Signed)
Pt is aware.  

## 2014-12-06 ENCOUNTER — Telehealth: Payer: Self-pay

## 2014-12-06 NOTE — Telephone Encounter (Signed)
Pt is calling because the liquid medication is not working and she would like to go by to taking the pill Phenergan. She is aware that it will need a PA for it. Please advise

## 2014-12-09 ENCOUNTER — Encounter: Payer: Medicare Other | Admitting: Adult Health

## 2014-12-09 MED ORDER — PROMETHAZINE HCL 25 MG PO TABS: 25.0000 mg | ORAL_TABLET | Freq: Four times a day (QID) | ORAL | Status: DC | PRN

## 2014-12-09 NOTE — Telephone Encounter (Signed)
Please do PA for phenergan pills. RX sent.

## 2014-12-09 NOTE — Telephone Encounter (Signed)
Route to JL for PA

## 2014-12-10 NOTE — Telephone Encounter (Signed)
Working on PA

## 2014-12-21 ENCOUNTER — Other Ambulatory Visit: Payer: Self-pay

## 2014-12-21 ENCOUNTER — Encounter: Payer: Self-pay | Admitting: Internal Medicine

## 2014-12-21 NOTE — Telephone Encounter (Signed)
I do not feel patient should be on phenergan chronically anyway. If she needs antiemetics, we should offer her an OV. Last seen over six months ago.

## 2014-12-21 NOTE — Telephone Encounter (Signed)
APPOINTMENT MADE AND LETTER SENT °

## 2014-12-21 NOTE — Telephone Encounter (Signed)
Please schedule ov and let her know. Thanks. 

## 2014-12-21 NOTE — Telephone Encounter (Signed)
Tried to do a PA for this. [employer] said that it was still within her 60 days from the denial from the first PA that was done and pt would need an appeal letter written and faxed to them.

## 2014-12-23 MED ORDER — LACTULOSE 10 GM/15ML PO SOLN: ORAL | Status: DC

## 2015-01-05 ENCOUNTER — Ambulatory Visit: Payer: Self-pay | Admitting: Pediatrics

## 2015-01-11 ENCOUNTER — Ambulatory Visit: Payer: Self-pay | Admitting: Gastroenterology

## 2015-02-02 ENCOUNTER — Ambulatory Visit: Payer: Self-pay | Admitting: Pediatrics

## 2015-02-15 ENCOUNTER — Other Ambulatory Visit: Payer: Self-pay | Admitting: Gastroenterology

## 2015-02-23 ENCOUNTER — Telehealth: Payer: Self-pay | Admitting: Internal Medicine

## 2015-02-23 NOTE — Telephone Encounter (Signed)
Noted, please list the preferred method of communication indicated by the patient(mail only).

## 2015-02-23 NOTE — Telephone Encounter (Signed)
noted 

## 2015-02-23 NOTE — Telephone Encounter (Signed)
Patient called this afternoon to say that she did not want Arletha GrippeRita Seim (mother) as her emergency contact.  I removed mother and asked was there another person I could list as a emergency contact. She said, "No".  I asked her if we could reach her at the phone number that was listed and she said that number had been disconnected.  I asked was there another number to reach her at if we needed to contact her. She said, "No".  She said that we had her address and that's all we needed to know. I verified address and it was correct. The patient however was [facility] and I put that number as a contact number. Please advise if I need to take that out and just communicate thru mail.  Patient sounded very lethargic and slow to speak on phone. At one point I thought she had hung up because she wasn't answering any of my questions.

## 2015-02-23 NOTE — Telephone Encounter (Signed)
, °

## 2015-02-23 NOTE — Telephone Encounter (Signed)
I removed alternate phone number. Patient will be notified by mail only.

## 2015-03-10 ENCOUNTER — Ambulatory Visit: Payer: Self-pay | Admitting: Family

## 2015-03-11 ENCOUNTER — Encounter: Payer: Self-pay | Admitting: Family

## 2015-03-11 ENCOUNTER — Ambulatory Visit (INDEPENDENT_AMBULATORY_CARE_PROVIDER_SITE_OTHER): Payer: Medicare Other | Admitting: Family

## 2015-03-11 ENCOUNTER — Encounter (INDEPENDENT_AMBULATORY_CARE_PROVIDER_SITE_OTHER): Payer: Self-pay

## 2015-03-11 VITALS — BP 106/66 | HR 110 | Temp 98.6°F | Ht 65.0 in | Wt 192.2 lb

## 2015-03-11 DIAGNOSIS — E039 Hypothyroidism, unspecified: Secondary | ICD-10-CM

## 2015-03-11 DIAGNOSIS — K219 Gastro-esophageal reflux disease without esophagitis: Secondary | ICD-10-CM

## 2015-03-11 DIAGNOSIS — F22 Delusional disorders: Secondary | ICD-10-CM

## 2015-03-11 DIAGNOSIS — F329 Major depressive disorder, single episode, unspecified: Secondary | ICD-10-CM

## 2015-03-11 DIAGNOSIS — J309 Allergic rhinitis, unspecified: Secondary | ICD-10-CM

## 2015-03-11 DIAGNOSIS — F411 Generalized anxiety disorder: Secondary | ICD-10-CM

## 2015-03-11 DIAGNOSIS — J4530 Mild persistent asthma, uncomplicated: Secondary | ICD-10-CM | POA: Diagnosis not present

## 2015-03-11 DIAGNOSIS — N318 Other neuromuscular dysfunction of bladder: Secondary | ICD-10-CM

## 2015-03-11 DIAGNOSIS — F32A Depression, unspecified: Secondary | ICD-10-CM

## 2015-03-11 DIAGNOSIS — E7800 Pure hypercholesterolemia, unspecified: Secondary | ICD-10-CM

## 2015-03-11 DIAGNOSIS — K5909 Other constipation: Secondary | ICD-10-CM | POA: Diagnosis not present

## 2015-03-11 MED ORDER — LEVOTHYROXINE SODIUM 100 MCG PO TABS: 100.0000 ug | ORAL_TABLET | Freq: Every day | ORAL | Status: DC

## 2015-03-11 MED ORDER — ALBUTEROL SULFATE HFA 108 (90 BASE) MCG/ACT IN AERS: 2.0000 | INHALATION_SPRAY | Freq: Four times a day (QID) | RESPIRATORY_TRACT | Status: DC | PRN

## 2015-03-11 NOTE — Progress Notes (Addendum)
Subjective:    Patient ID: Shirley Perry, female    DOB: 06/29/1975, 39 y.o.   MRN: [id number]  Pt presents to the office  Today to establish care. PT goes to [facility] every month for GAD, Depression, and paranoid state. Pt is paranoid today and states she does not want to sign any paperwork today, not even to authorize billing  Or consent. Asthma She complains of cough. There is no difficulty breathing, shortness of breath or wheezing. This is a chronic problem. The current episode started more than 1 year ago. The problem occurs intermittently. The problem has been waxing and waning. The cough is productive of sputum. Pertinent negatives include no dyspnea on exertion, fever, headaches, heartburn, nasal congestion, orthopnea, rhinorrhea or sore throat. Her symptoms are alleviated by rest and ipratropium. She reports moderate improvement on treatment. Her past medical history is significant for asthma. There is no history of COPD.  Thyroid Problem Presents for follow-up visit. Symptoms include anxiety and constipation. Patient reports no fatigue, hair loss or palpitations. The symptoms have been stable. Past treatments include levothyroxine. The treatment provided moderate relief.  Gastroesophageal Reflux She complains of coughing. She reports no heartburn, no sore throat or no wheezing. This is a chronic problem. The current episode started more than 1 year ago. The problem occurs occasionally. The problem has been waxing and waning. The symptoms are aggravated by certain foods and lying down. Pertinent negatives include no fatigue or orthopnea. Risk factors include obesity. She has tried a PPI for the symptoms. The treatment provided moderate relief.  Constipation This is a chronic problem. The current episode started more than 1 year ago. The problem has been waxing and waning since onset. Her stool frequency is 4 to 5 times per week. The patient is on a high fiber diet. She does not  exercise regularly. There has been adequate water intake. Associated symptoms include bloating. Pertinent negatives include no back pain or fever. She has tried laxatives (linzess) for the symptoms. The treatment provided moderate relief.  OAB Pt currently taking oxybutynin 15 mg daily. PT states this helps with her OAB and no concerns at this time.     Review of Systems  Constitutional: Negative.  Negative for fever and fatigue.  HENT: Negative.  Negative for rhinorrhea and sore throat.   Eyes: Negative.   Respiratory: Positive for cough. Negative for shortness of breath and wheezing.   Cardiovascular: Negative.  Negative for dyspnea on exertion and palpitations.  Gastrointestinal: Positive for constipation and bloating. Negative for heartburn.  Endocrine: Negative.   Genitourinary: Negative.   Musculoskeletal: Negative.  Negative for back pain.  Neurological: Negative.  Negative for headaches.  Hematological: Negative.   Psychiatric/Behavioral: The patient is nervous/anxious.   All other systems reviewed and are negative.      Objective:   Physical Exam  Constitutional: She is oriented to person, place, and time. She appears well-developed and well-nourished. No distress.  HENT:  Head: Normocephalic and atraumatic.  Right Ear: External ear normal.  Left Ear: External ear normal.  Nose: Nose normal.  Mouth/Throat: Oropharynx is clear and moist.  Eyes: Pupils are equal, round, and reactive to light.  Neck: Normal range of motion. Neck supple. No thyromegaly present.  Cardiovascular: Normal rate, regular rhythm, normal heart sounds and intact distal pulses.   No murmur heard. Pulmonary/Chest: Effort normal and breath sounds normal. No respiratory distress. She has no wheezes.  Abdominal: Soft. Bowel sounds are normal. She exhibits no distension.  There is no tenderness.  Musculoskeletal: Normal range of motion. She exhibits no edema or tenderness.  Neurological: She is alert and  oriented to person, place, and time. She has normal reflexes. No cranial nerve deficit.  Skin: Skin is warm and dry.  Psychiatric: She has a normal mood and affect. Her behavior is normal. Judgment and thought content normal.  Vitals reviewed.    BP 106/66 mmHg  Pulse 110  Temp(Src) 98.6 F (37 C) (Oral)  Ht _0  (1.651 m)  Wt 192 lb 3.2 oz (87.181 kg)  BMI 31.98 kg/m2      Assessment & Plan:  1. Allergic rhinitis, unspecified allergic rhinitis type - CMP14+EGFR  2. Hypothyroidism, unspecified hypothyroidism type - CMP14+EGFR - Thyroid Panel With TSH - levothyroxine (SYNTHROID, LEVOTHROID) 100 MCG tablet; Take 1 tablet (100 mcg total) by mouth daily before breakfast.  Dispense: 90 tablet; Refill: 1  3. Gastroesophageal reflux disease, esophagitis presence not specified - CMP14+EGFR  4. Asthma, mild persistent, uncomplicated - CHE52+DPOE - albuterol (PROVENTIL HFA;VENTOLIN HFA) 108 (90 BASE) MCG/ACT inhaler; Inhale 2 puffs into the lungs every 6 (six) hours as needed for wheezing or shortness of breath.  Dispense: 1 Inhaler; Refill: 2  5. Paranoid state ([place]) - CMP14+EGFR  6. Anxiety state - CMP14+EGFR  7. Depression - CMP14+EGFR  8. OVERACTIVE BLADDER - CMP14+EGFR  9. CONSTIPATION, CHRONIC - CMP14+EGFR  10. HYPERCHOLESTEROLEMIA - CMP14+EGFR - Lipid panel   Continue all meds Labs pending Health Maintenance reviewed Diet and exercise encouraged RTO 6 months  *Addendum- Pt refused lab work today and states she does not want any blood work done today. I stressed with patient that without blood work we could not refill her medications. Pt states she does not care and does not want blood work.   Evelina Dun, FNP

## 2015-03-11 NOTE — Patient Instructions (Signed)
Health Maintenance, Female Adopting a healthy lifestyle and getting preventive care can go a long way to promote health and wellness. Talk with your health care provider about what schedule of regular examinations is right for you. This is a good chance for you to check in with your provider about disease prevention and staying healthy. In between checkups, there are plenty of things you can do on your own. Experts have done a lot of research about which lifestyle changes and preventive measures are most likely to keep you healthy. Ask your health care provider for more information. WEIGHT AND DIET  Eat a healthy diet  Be sure to include plenty of vegetables, fruits, low-fat dairy products, and lean protein.  Do not eat a lot of foods high in solid fats, added sugars, or salt.  Get regular exercise. This is one of the most important things you can do for your health.  Most adults should exercise for at least 150 minutes each week. The exercise should increase your heart rate and make you sweat (moderate-intensity exercise).  Most adults should also do strengthening exercises at least twice a week. This is in addition to the moderate-intensity exercise.  Maintain a healthy weight  Body mass index (BMI) is a measurement that can be used to identify possible weight problems. It estimates body fat based on height and weight. Your health care provider can help determine your BMI and help you achieve or maintain a healthy weight.  For females 20 years of age and older:   A BMI below 18.5 is considered underweight.  A BMI of 18.5 to 24.9 is normal.  A BMI of 25 to 29.9 is considered overweight.  A BMI of 30 and above is considered obese.  Watch levels of cholesterol and blood lipids  You should start having your blood tested for lipids and cholesterol at 39 years of age, then have this test every 5 years.  You may need to have your cholesterol levels checked more often if:  Your lipid  or cholesterol levels are high.  You are older than 39 years of age.  You are at high risk for heart disease.  CANCER SCREENING   Lung Cancer  Lung cancer screening is recommended for adults 55-80 years old who are at high risk for lung cancer because of a history of smoking.  A yearly low-dose CT scan of the lungs is recommended for people who:  Currently smoke.  Have quit within the past 15 years.  Have at least a 30-pack-year history of smoking. A pack year is smoking an average of one pack of cigarettes a day for 1 year.  Yearly screening should continue until it has been 15 years since you quit.  Yearly screening should stop if you develop a health problem that would prevent you from having lung cancer treatment.  Breast Cancer  Practice breast self-awareness. This means understanding how your breasts normally appear and feel.  It also means doing regular breast self-exams. Let your health care provider know about any changes, no matter how small.  If you are in your 20s or 30s, you should have a clinical breast exam (CBE) by a health care provider every 1-3 years as part of a regular health exam.  If you are 40 or older, have a CBE every year. Also consider having a breast X-ray (mammogram) every year.  If you have a family history of breast cancer, talk to your health care provider about genetic screening.  If you   are at high risk for breast cancer, talk to your health care provider about having an MRI and a mammogram every year.  Breast cancer gene (BRCA) assessment is recommended for women who have family members with BRCA-related cancers. BRCA-related cancers include:  Breast.  Ovarian.  Tubal.  Peritoneal cancers.  Results of the assessment will determine the need for genetic counseling and BRCA1 and BRCA2 testing. Cervical Cancer Your health care provider may recommend that you be screened regularly for cancer of the pelvic organs (ovaries, uterus, and  vagina). This screening involves a pelvic examination, including checking for microscopic changes to the surface of your cervix (Pap test). You may be encouraged to have this screening done every 3 years, beginning at age 21.  For women ages 30-65, health care providers may recommend pelvic exams and Pap testing every 3 years, or they may recommend the Pap and pelvic exam, combined with testing for human papilloma virus (HPV), every 5 years. Some types of HPV increase your risk of cervical cancer. Testing for HPV may also be done on women of any age with unclear Pap test results.  Other health care providers may not recommend any screening for nonpregnant women who are considered low risk for pelvic cancer and who do not have symptoms. Ask your health care provider if a screening pelvic exam is right for you.  If you have had past treatment for cervical cancer or a condition that could lead to cancer, you need Pap tests and screening for cancer for at least 20 years after your treatment. If Pap tests have been discontinued, your risk factors (such as having a new sexual partner) need to be reassessed to determine if screening should resume. Some women have medical problems that increase the chance of getting cervical cancer. In these cases, your health care provider may recommend more frequent screening and Pap tests. Colorectal Cancer  This type of cancer can be detected and often prevented.  Routine colorectal cancer screening usually begins at 39 years of age and continues through 39 years of age.  Your health care provider may recommend screening at an earlier age if you have risk factors for colon cancer.  Your health care provider may also recommend using home test kits to check for hidden blood in the stool.  A small camera at the end of a tube can be used to examine your colon directly (sigmoidoscopy or colonoscopy). This is done to check for the earliest forms of colorectal  cancer.  Routine screening usually begins at age 50.  Direct examination of the colon should be repeated every 5-10 years through 39 years of age. However, you may need to be screened more often if early forms of precancerous polyps or small growths are found. Skin Cancer  Check your skin from head to toe regularly.  Tell your health care provider about any new moles or changes in moles, especially if there is a change in a mole's shape or color.  Also tell your health care provider if you have a mole that is larger than the size of a pencil eraser.  Always use sunscreen. Apply sunscreen liberally and repeatedly throughout the day.  Protect yourself by wearing long sleeves, pants, a wide-brimmed hat, and sunglasses whenever you are outside. HEART DISEASE, DIABETES, AND HIGH BLOOD PRESSURE   High blood pressure causes heart disease and increases the risk of stroke. High blood pressure is more likely to develop in:  People who have blood pressure in the high end   of the normal range (130-139/85-89 mm Hg).  People who are overweight or obese.  People who are African American.  If you are 38-23 years of age, have your blood pressure checked every 3-5 years. If you are 61 years of age or older, have your blood pressure checked every year. You should have your blood pressure measured twice--once when you are at a hospital or clinic, and once when you are not at a hospital or clinic. Record the average of the two measurements. To check your blood pressure when you are not at a hospital or clinic, you can use:  An automated blood pressure machine at a pharmacy.  A home blood pressure monitor.  If you are between 45 years and 39 years old, ask your health care provider if you should take aspirin to prevent strokes.  Have regular diabetes screenings. This involves taking a blood sample to check your fasting blood sugar level.  If you are at a normal weight and have a low risk for diabetes,  have this test once every three years after 39 years of age.  If you are overweight and have a high risk for diabetes, consider being tested at a younger age or more often. PREVENTING INFECTION  Hepatitis B  If you have a higher risk for hepatitis B, you should be screened for this virus. You are considered at high risk for hepatitis B if:  You were born in a country where hepatitis B is common. Ask your health care provider which countries are considered high risk.  Your parents were born in a high-risk country, and you have not been immunized against hepatitis B (hepatitis B vaccine).  You have HIV or AIDS.  You use needles to inject street drugs.  You live with someone who has hepatitis B.  You have had sex with someone who has hepatitis B.  You get hemodialysis treatment.  You take certain medicines for conditions, including cancer, organ transplantation, and autoimmune conditions. Hepatitis C  Blood testing is recommended for:  Everyone born from [birth year] through 1965.  Anyone with known risk factors for hepatitis C. Sexually transmitted infections (STIs)  You should be screened for sexually transmitted infections (STIs) including gonorrhea and chlamydia if:  You are sexually active and are younger than 39 years of age.  You are older than 39 years of age and your health care provider tells you that you are at risk for this type of infection.  Your sexual activity has changed since you were last screened and you are at an increased risk for chlamydia or gonorrhea. Ask your health care provider if you are at risk.  If you do not have HIV, but are at risk, it may be recommended that you take a prescription medicine daily to prevent HIV infection. This is called pre-exposure prophylaxis (PrEP). You are considered at risk if:  You are sexually active and do not regularly use condoms or know the HIV status of your partner(s).  You take drugs by injection.  You are sexually  active with a partner who has HIV. Talk with your health care provider about whether you are at high risk of being infected with HIV. If you choose to begin PrEP, you should first be tested for HIV. You should then be tested every 3 months for as long as you are taking PrEP.  PREGNANCY   If you are premenopausal and you may become pregnant, ask your health care provider about preconception counseling.  If you may  become pregnant, take 400 to 800 micrograms (mcg) of folic acid every day.  If you want to prevent pregnancy, talk to your health care provider about birth control (contraception). OSTEOPOROSIS AND MENOPAUSE   Osteoporosis is a disease in which the bones lose minerals and strength with aging. This can result in serious bone fractures. Your risk for osteoporosis can be identified using a bone density scan.  If you are 61 years of age or older, or if you are at risk for osteoporosis and fractures, ask your health care provider if you should be screened.  Ask your health care provider whether you should take a calcium or vitamin D supplement to lower your risk for osteoporosis.  Menopause may have certain physical symptoms and risks.  Hormone replacement therapy may reduce some of these symptoms and risks. Talk to your health care provider about whether hormone replacement therapy is right for you.  HOME CARE INSTRUCTIONS   Schedule regular health, dental, and eye exams.  Stay current with your immunizations.   Do not use any tobacco products including cigarettes, chewing tobacco, or electronic cigarettes.  If you are pregnant, do not drink alcohol.  If you are breastfeeding, limit how much and how often you drink alcohol.  Limit alcohol intake to no more than 1 drink per day for nonpregnant women. One drink equals 12 ounces of beer, 5 ounces of wine, or 1 ounces of hard liquor.  Do not use street drugs.  Do not share needles.  Ask your health care provider for help if  you need support or information about quitting drugs.  Tell your health care provider if you often feel depressed.  Tell your health care provider if you have ever been abused or do not feel safe at home.   This information is not intended to replace advice given to you by your health care provider. Make sure you discuss any questions you have with your health care provider.   Document Released: 10/23/2010 Document Revised: 04/30/2014 Document Reviewed: 03/11/2013 Elsevier Interactive Patient Education [employer].

## 2015-03-14 ENCOUNTER — Telehealth: Payer: Self-pay | Admitting: Family

## 2015-03-14 NOTE — Telephone Encounter (Signed)
I can not prescribe medication without lab work. PT was told this during her visit., but still refused lab work. If patient would like to get blood work drawn I will send in rx

## 2015-03-14 NOTE — Telephone Encounter (Signed)
See your note from 03/11/15 about labs

## 2015-03-14 NOTE — Telephone Encounter (Signed)
Pt was advised of md feedback, pt still requesting rx be sent in. I offered an appt and to put order in but pt declined.

## 2015-03-16 ENCOUNTER — Ambulatory Visit: Payer: Medicare Other | Admitting: Pediatrics

## 2015-03-21 ENCOUNTER — Encounter: Payer: Self-pay | Admitting: Family

## 2015-04-27 ENCOUNTER — Ambulatory Visit (INDEPENDENT_AMBULATORY_CARE_PROVIDER_SITE_OTHER): Payer: Medicare Other | Admitting: Nurse Practitioner

## 2015-04-27 ENCOUNTER — Encounter: Payer: Self-pay | Admitting: Nurse Practitioner

## 2015-04-27 VITALS — BP 109/73 | HR 100 | Temp 98.0°F | Ht 65.0 in | Wt 200.0 lb

## 2015-04-27 DIAGNOSIS — L409 Psoriasis, unspecified: Secondary | ICD-10-CM

## 2015-04-27 DIAGNOSIS — E039 Hypothyroidism, unspecified: Secondary | ICD-10-CM | POA: Diagnosis not present

## 2015-04-27 DIAGNOSIS — R05 Cough: Secondary | ICD-10-CM | POA: Diagnosis not present

## 2015-04-27 DIAGNOSIS — R059 Cough, unspecified: Secondary | ICD-10-CM

## 2015-04-27 MED ORDER — LEVOTHYROXINE SODIUM 100 MCG PO TABS: 100.0000 ug | ORAL_TABLET | Freq: Every day | ORAL | Status: DC

## 2015-04-27 MED ORDER — PREDNISONE 10 MG (21) PO TBPK: 10.0000 mg | ORAL_TABLET | Freq: Every day | ORAL | Status: DC

## 2015-04-27 MED ORDER — KETOCONAZOLE 2 % EX SHAM: 1.0000 "application " | MEDICATED_SHAMPOO | CUTANEOUS | Status: DC

## 2015-04-27 NOTE — Patient Instructions (Signed)

## 2015-04-27 NOTE — Progress Notes (Signed)
   Subjective:    Patient ID: Shirley Perry, female    DOB: 1976-02-10, 40 y.o.   MRN: [id number]  HPI Patient in today to discuss the following: - hypothyroidism- She is currently on levothyroxin 100mcg daily- doing well on current dose- no complaints of fatigue, hair loss, etc. - Psoriasis- has had for years but getting bad in her scalp and wants something for it. -Cold symptoms- cough and congestion for several months- intermittent- no fever. Worse at night when laying down. She is on omeprazole for GERD    Review of Systems  Constitutional: Negative for fever and chills.  HENT: Positive for congestion. Negative for ear pain, rhinorrhea, sinus pressure and trouble swallowing.   Respiratory: Positive for cough (productive at times).   Cardiovascular: Negative.   Gastrointestinal: Negative.   Genitourinary: Negative.   Neurological: Negative.   Psychiatric/Behavioral: Negative.   All other systems reviewed and are negative.      Objective:   Physical Exam  Constitutional: She is oriented to person, place, and time. She appears well-developed and well-nourished. No distress.  HENT:  Right Ear: External ear normal.  Left Ear: External ear normal.  Nose: Nose normal.  Mouth/Throat: Oropharynx is clear and moist.  Eyes: Pupils are equal, round, and reactive to light.  Neck: Normal range of motion. Neck supple.  Cardiovascular: Normal rate, regular rhythm and normal heart sounds.   Pulmonary/Chest: Effort normal and breath sounds normal.  Dry cough  Neurological: She is alert and oriented to person, place, and time.  Skin: Skin is warm and dry.  Psychiatric: She has a normal mood and affect. Her behavior is normal. Judgment and thought content normal.  Vitals reviewed.  BP 109/73 mmHg  Pulse 100  Temp(Src) 98 F (36.7 C) (Oral)  Ht 5\' 5"  (1.651 m)  Wt 200 lb (90.719 kg)  BMI 33.28 kg/m2        Assessment & Plan:  1. Hypothyroidism, unspecified hypothyroidism  type PATIENT HAS NOT HAD BLOOD WORK ACCORDING TO OUR RECORDS- SHE SAYS SHE HAD IT DONE LAST AUGUST BUT WE DO NOT HAVE ACCESS  TO THE INFORMATION AND SHE WILL NEED TO HAVE RECHECKED IN FEBRUARY ANYWAY BUT PATIENT REFUSES TO HAVE BLOOD WORK DONE- WAS TOLD WE WILL ONLY DO 1 MONTHS SUPPLY OF HER LEVOTHYROXINE AND OF SHE DOES NOT HAVE BLOOD WORK DONE THEN WE WILL NOT CONTINUE TO RX MEDS. - levothyroxine (SYNTHROID, LEVOTHROID) 100 MCG tablet; Take 1 tablet (100 mcg total) by mouth daily before breakfast.  Dispense: 30 tablet; Refill: 0  2. Psoriasis of scalp ONLY WASH HAIR EVERY OTHER DAY - ketoconazole (NIZORAL) 2 % shampoo; Apply 1 application topically 2 (two) times a week.  Dispense: 120 mL; Refill: 0  3. Cough Force fluids RTO prn - predniSONE (STERAPRED UNI-PAK 21 TAB) 10 MG (21) TBPK tablet; Take 1 tablet (10 mg total) by mouth daily. As directed x 6 days  Dispense: 21 tablet; Refill: 0    * spent 30 minutes with patient trying  To explain importance of blood work. She refuses.   Mary-Margaret Daphine DeutscherMartin, FNP

## 2015-05-04 ENCOUNTER — Other Ambulatory Visit: Payer: Self-pay | Admitting: Gastroenterology

## 2015-05-09 ENCOUNTER — Other Ambulatory Visit: Payer: Self-pay | Admitting: Nurse Practitioner

## 2015-05-10 NOTE — Telephone Encounter (Signed)
Last seen 05/03/15 MMM no thyroid level in EPIC

## 2015-05-10 NOTE — Telephone Encounter (Signed)
Patient was seen on 1/4 for thyroid medication rck. Does patient need to see you or just labs?

## 2015-05-10 NOTE — Telephone Encounter (Signed)
Patient needs appointment to be seen.

## 2015-05-10 NOTE — Telephone Encounter (Signed)
Last refill without being seen 

## 2015-05-11 NOTE — Telephone Encounter (Signed)
Detailed message left for patient that she must be seen.

## 2015-05-16 ENCOUNTER — Other Ambulatory Visit: Payer: Self-pay | Admitting: Nurse Practitioner

## 2015-06-07 ENCOUNTER — Other Ambulatory Visit: Payer: Self-pay

## 2015-06-08 ENCOUNTER — Telehealth: Payer: Self-pay | Admitting: Internal Medicine

## 2015-06-08 NOTE — Telephone Encounter (Signed)
Pt called  asking if I would mail her a release of information/records to her address.  I told her I would put it in the mail for pick up this morning.

## 2015-06-14 ENCOUNTER — Ambulatory Visit (INDEPENDENT_AMBULATORY_CARE_PROVIDER_SITE_OTHER): Payer: Medicare Other | Admitting: Family

## 2015-06-14 ENCOUNTER — Encounter: Payer: Self-pay | Admitting: Family

## 2015-06-14 ENCOUNTER — Telehealth: Payer: Self-pay | Admitting: Family

## 2015-06-14 VITALS — BP 111/74 | HR 93 | Temp 98.3°F | Ht 65.0 in | Wt 203.0 lb

## 2015-06-14 DIAGNOSIS — J309 Allergic rhinitis, unspecified: Secondary | ICD-10-CM

## 2015-06-14 DIAGNOSIS — J069 Acute upper respiratory infection, unspecified: Secondary | ICD-10-CM | POA: Diagnosis not present

## 2015-06-14 MED ORDER — BENZONATATE 200 MG PO CAPS: 200.0000 mg | ORAL_CAPSULE | Freq: Three times a day (TID) | ORAL | Status: DC | PRN

## 2015-06-14 MED ORDER — MOMETASONE FUROATE 50 MCG/ACT NA SUSP: 2.0000 | Freq: Every day | NASAL | Status: DC

## 2015-06-14 NOTE — Patient Instructions (Addendum)
Allergic Rhinitis Allergic rhinitis is when the mucous membranes in the nose respond to allergens. Allergens are particles in the air that cause your body to have an allergic reaction. This causes you to release allergic antibodies. Through a chain of events, these eventually cause you to release histamine into the blood stream. Although meant to protect the body, it is this release of histamine that causes your discomfort, such as frequent sneezing, congestion, and an itchy, runny nose.  CAUSES Seasonal allergic rhinitis (hay fever) is caused by pollen allergens that may come from grasses, trees, and weeds. Year-round allergic rhinitis (perennial allergic rhinitis) is caused by allergens such as house dust mites, pet dander, and mold spores. SYMPTOMS  Nasal stuffiness (congestion).  Itchy, runny nose with sneezing and tearing of the eyes. DIAGNOSIS Your health care provider can help you determine the allergen or allergens that trigger your symptoms. If you and your health care provider are unable to determine the allergen, skin or blood testing may be used. Your health care provider will diagnose your condition after taking your health history and performing a physical exam. Your health care provider may assess you for other related conditions, such as asthma, pink eye, or an ear infection. TREATMENT Allergic rhinitis does not have a cure, but it can be controlled by:  Medicines that block allergy symptoms. These may include allergy shots, nasal sprays, and oral antihistamines.  Avoiding the allergen. Hay fever may often be treated with antihistamines in pill or nasal spray forms. Antihistamines block the effects of histamine. There are over-the-counter medicines that may help with nasal congestion and swelling around the eyes. Check with your health care provider before taking or giving this medicine. If avoiding the allergen or the medicine prescribed do not work, there are many new medicines  your health care provider can prescribe. Stronger medicine may be used if initial measures are ineffective. Desensitizing injections can be used if medicine and avoidance does not work. Desensitization is when a patient is given ongoing shots until the body becomes less sensitive to the allergen. Make sure you follow up with your health care provider if problems continue. HOME CARE INSTRUCTIONS It is not possible to completely avoid allergens, but you can reduce your symptoms by taking steps to limit your exposure to them. It helps to know exactly what you are allergic to so that you can avoid your specific triggers. SEEK MEDICAL CARE IF:  You have a fever.  You develop a cough that does not stop easily (persistent).  You have shortness of breath.  You start wheezing.  Symptoms interfere with normal daily activities.   This information is not intended to replace advice given to you by your health care provider. Make sure you discuss any questions you have with your health care provider.   Document Released: 01/02/2001 Document Revised: 04/30/2014 Document Reviewed: 12/15/2012 Elsevier Interactive Patient Education 2016 [place] meds as prescribed - Use a cool mist humidifier  -Use saline nose sprays frequently -Saline irrigations of the nose can be very helpful if done frequently.  * 4X daily for 1 week*  * Use of a nettie pot can be helpful with this. Follow directions with this* -Force fluids -For any cough or congestion  Use plain Mucinex- regular strength or max strength is fine   * Children- consult with Pharmacist for dosing -For fever or aces or pains- take tylenol or ibuprofen appropriate for age and weight.  * for fevers greater than 101 orally  you may alternate ibuprofen and tylenol every  3 hours. -Throat lozenges if help -New toothbrush in 3 days   Mahlia Fernando, FNP  

## 2015-06-14 NOTE — Progress Notes (Signed)
Subjective:    Patient ID: Shirley Perry, female    DOB: Mar 03, 1976, 40 y.o.   MRN: [id number]  Ms Beldin presents with complaints of a dry non-productive cough that has been present for over a month.  She is not presently coughing. Cough This is a new problem. The current episode started more than 1 month ago. The problem has been unchanged. The problem occurs every few hours. The cough is non-productive. Associated symptoms include ear pain, eye redness and headaches. Pertinent negatives include no chest pain, fever, nasal congestion, postnasal drip, rhinorrhea, sore throat or shortness of breath. Nothing aggravates the symptoms. She has tried OTC cough suppressant for the symptoms. The treatment provided mild relief. Her past medical history is significant for environmental allergies.      Review of Systems  Constitutional: Negative.  Negative for fever.  HENT: Positive for ear pain. Negative for postnasal drip, rhinorrhea and sore throat.   Eyes: Positive for redness and itching. Negative for pain, discharge and visual disturbance.  Respiratory: Positive for cough. Negative for shortness of breath.   Cardiovascular: Negative for chest pain.  Endocrine: Negative.   Skin: Negative.   Allergic/Immunologic: Positive for environmental allergies.  Neurological: Positive for headaches.  Psychiatric/Behavioral: The patient is not nervous/anxious.        Objective:   Physical Exam  Constitutional: She appears well-developed and well-nourished.  HENT:  Head: Normocephalic.  Right Ear: External ear normal.  Left Ear: External ear normal.  Nose: Nose normal.  Mouth/Throat: Oropharynx is clear and moist.  Eyes: Conjunctivae and lids are normal. Pupils are equal, round, and reactive to light. Right eye exhibits no discharge. Left eye exhibits no discharge.  Neck: No thyromegaly present.  Cardiovascular: Normal rate, regular rhythm and normal heart sounds.  Exam reveals no friction rub.     No murmur heard. Pulmonary/Chest: Effort normal and breath sounds normal. No respiratory distress. She has no wheezes. She has no rales.  Abdominal: Soft. Bowel sounds are normal.  Lymphadenopathy:       Head (right side): No preauricular and no posterior auricular adenopathy present.       Head (left side): No preauricular and no posterior auricular adenopathy present.    She has no cervical adenopathy.  Skin: Skin is warm, dry and intact.  Psychiatric: Her speech is normal. Her affect is blunt.    BP 111/74 mmHg  Pulse 93  Temp(Src) 98.3 F (36.8 C) (Oral)  Ht  (1.651 m)  Wt 203 lb (92.08 kg)  BMI 33.78 kg/m2  SpO2 99%       Assessment & Plan:  1. Allergic rhinitis, unspecified allergic rhinitis type - mometasone (NASONEX) 50 MCG/ACT nasal spray; Place 2 sprays into the nose daily.  Dispense: 17 g; Refill: 12  2. Viral upper respiratory infection -- Take meds as prescribed - Use a cool mist humidifier  -Use saline nose sprays frequently -Saline irrigations of the nose can be very helpful if done frequently.  * 4X daily for 1 week*  * Use of a nettie pot can be helpful with this. Follow directions with this* -Force fluids -For any cough or congestion  Use plain Mucinex- regular strength or max strength is fine   * Children- consult with Pharmacist for dosing -For fever or aces or pains- take tylenol or ibuprofen appropriate for age and weight.  * for fevers greater than 101 orally you may alternate ibuprofen and tylenol every  3 hours. -Throat lozenges if help -New  toothbrush in 3 days - benzonatate (TESSALON) 200 MG capsule; Take 1 capsule (200 mg total) by mouth 3 (three) times daily as needed.  Dispense: 30 capsule; Refill: 1  Jannifer Rodney, FNP

## 2015-06-15 ENCOUNTER — Other Ambulatory Visit: Payer: Self-pay | Admitting: Nurse Practitioner

## 2015-06-15 ENCOUNTER — Telehealth: Payer: Self-pay

## 2015-06-15 NOTE — Telephone Encounter (Signed)
lmtcb 2/22 jr

## 2015-06-15 NOTE — Telephone Encounter (Signed)
lmtcb

## 2015-06-15 NOTE — Telephone Encounter (Signed)
Spoke with Dr.Dettinger who is covering PCP and called and told pt to try otc robitussin dm or mucinex. Also advised pt to continue to use her flonase at night per Dr.Dettinger. Pt aware and understanding.

## 2015-06-15 NOTE — Telephone Encounter (Signed)
Please advise. Your the covering PCP

## 2015-06-15 NOTE — Telephone Encounter (Signed)
Please advise. Your covering PCP

## 2015-06-15 NOTE — Telephone Encounter (Signed)
No TSH in epic  

## 2015-06-16 ENCOUNTER — Telehealth: Payer: Self-pay | Admitting: Family

## 2015-06-16 NOTE — Telephone Encounter (Signed)
Stp that refill was sent to pharmacy & appt needs to be made in next 3-4 weeks

## 2015-06-16 NOTE — Telephone Encounter (Signed)
Levothyroxine was not changed according to our records- patient refuses to have blood work done-

## 2015-06-16 NOTE — Telephone Encounter (Signed)
Pt aware refill sent to pharmacy & appt needs to be made in next 3-4 weeks

## 2015-06-16 NOTE — Telephone Encounter (Signed)
Last refill without being seen 

## 2015-07-12 ENCOUNTER — Telehealth: Payer: Self-pay | Admitting: Family

## 2015-07-13 NOTE — Telephone Encounter (Signed)
Denied.

## 2015-07-14 ENCOUNTER — Other Ambulatory Visit: Payer: Self-pay | Admitting: Nurse Practitioner

## 2015-07-14 NOTE — Telephone Encounter (Signed)
No TSH in epic  

## 2015-07-15 ENCOUNTER — Telehealth: Payer: Self-pay | Admitting: *Deleted

## 2015-07-15 NOTE — Telephone Encounter (Signed)
Patient cannot have anymore refills without having labs drawn to check thyroid- patient has been told this by me and christy Hawks.

## 2015-07-15 NOTE — Telephone Encounter (Signed)
Patient called wanting thyroid medication refilled. After looking at chart there is not a current thyroid level in her chart and she was told she would have to come in and get a level drawn. I told her there would be no more refills until she came into the office for blood work. She said it was against the law to refuse a patient medication and she had already contacted her lawyer. I told her I was sorry but we would not be able to fill it until she has bloodwork.

## 2015-07-20 ENCOUNTER — Ambulatory Visit (INDEPENDENT_AMBULATORY_CARE_PROVIDER_SITE_OTHER): Payer: Medicare Other | Admitting: Family Medicine

## 2015-07-20 ENCOUNTER — Encounter: Payer: Self-pay | Admitting: Family Medicine

## 2015-07-20 VITALS — BP 136/65 | HR 94 | Temp 97.5°F | Ht 65.0 in | Wt 200.6 lb

## 2015-07-20 DIAGNOSIS — J4530 Mild persistent asthma, uncomplicated: Secondary | ICD-10-CM

## 2015-07-20 DIAGNOSIS — L304 Erythema intertrigo: Secondary | ICD-10-CM | POA: Diagnosis not present

## 2015-07-20 DIAGNOSIS — E039 Hypothyroidism, unspecified: Secondary | ICD-10-CM | POA: Diagnosis not present

## 2015-07-20 MED ORDER — LEVOTHYROXINE SODIUM 100 MCG PO TABS: 100.0000 ug | ORAL_TABLET | Freq: Every day | ORAL | Status: DC

## 2015-07-20 MED ORDER — NYSTATIN 100000 UNIT/GM EX CREA: 1.0000 "application " | TOPICAL_CREAM | Freq: Two times a day (BID) | CUTANEOUS | Status: DC

## 2015-07-20 MED ORDER — ALBUTEROL SULFATE HFA 108 (90 BASE) MCG/ACT IN AERS: 2.0000 | INHALATION_SPRAY | Freq: Four times a day (QID) | RESPIRATORY_TRACT | Status: DC | PRN

## 2015-07-20 NOTE — Progress Notes (Signed)
   HPI  Patient presents today  Here for rash and albuterol refill  Rash Not painful or itchy, present under breasts X 5-7 days Previously present once before No new detergents or clothing No creams tried yet, previously resolved with hydrocortisone  Asthma Uses albuterol only as needed less than 3 X per week Needs refill  Hypothyroidism Requestsrefill of synthroid as I leave the room Records show she hasnt had TSH checked since 2014 Pt refuses to get labs checked stating that she just doesn't think that she wants to change the medicine I explained it several times Asked her to et labs sent to us from [facility] if they checked, she states it was checked in august- she doesn't think she can  PMH: Smoking status noted ROS: Per HPI  Objective: BP 136/65 mmHg  Pulse 94  Temp(Src) 97.5 F (36.4 C) (Oral)  Ht 5\' 5"  (1.651 m)  Wt 200 lb 9.6 oz (90.992 kg)  BMI 33.38 kg/m2 Gen: NAD, alert, cooperative with exam HEENT: NCAT CV: RRR, good S1/S2, no murmur Resp: CTABL, no wheezes, non-labored Ext: No edema, warm Neuro: Alert and oriented, No gross deficits Skin: und BL breasts (exam with female CMA present [employer] FinancialJessica Rostosky), has slight erythema and satellite lesions, in umbilical fold as well  Assessment and plan:  #  Intertrigo Treat with nystatin ream   # asthma Mild intermitttent ,refilled albuterol  # hypothyroidism Unclear why she does not want to get her labs checked, TSH and CMP ordered today She basically refused to do that,I agreed to give her one last month of refill, [facility] or cannot get labs checked I will not refill more Rx adjusted by telephone    Meds ordered this encounter  Medications  . albuterol (PROVENTIL HFA;VENTOLIN HFA) 108 (90 Base) MCG/ACT inhaler    Sig: Inhale 2 puffs into the lungs every 6 (six) hours as needed for wheezing or shortness of breath.    Dispense:  1 Inhaler    Refill:  2  . nystatin cream  (MYCOSTATIN)    Sig: Apply 1 application topically 2 (two) times daily.    Dispense:  30 g    Refill:  1  . levothyroxine (SYNTHROID, LEVOTHROID) 100 MCG tablet    Sig: Take 1 tablet (100 mcg total) by mouth daily before breakfast.    Dispense:  90 tablet    Refill:  3    Generic For:*SYNTHROID  100MCG (TAB)     After discovering patients unwillingness to have TSH checked, my nurse has changed her Rx to #30 and 0 refills. I have explained   Murtis SinkSam Claris Guymon, MD [facility] 07/20/2015, 3:37 PM

## 2015-07-20 NOTE — Patient Instructions (Signed)
Great to meet you!  You have intertrigo, a mild yeast infection of the skin folds.   Try the cream twice daily until it is healed, usually 5-7 days  Come back if you have any worsening symptoms or do not get better as expected within 2-3 weeks.   Intertrigo Intertrigo is a skin irritation (inflammation) that happens in warm, moist areas of the body. It happens mostly between folds of skin or where skin rubs together. HOME CARE  Keep the affected area cool and dry.  Leave the skin folds open to air.  Put cotton or linen between the folds of skin.  Avoid tight clothing.  Wear open-toed shoes or sandals.  Use powder on the affected area as told by your doctor.  Only use medicated creams or pastes as told by your doctor. GET HELP RIGHT AWAY IF:  The rash does not get better after 1 week of treatment.  The rash gets worse.  You have a fever or chills. MAKE SURE YOU:  Understand these instructions.  Will watch your condition.  Will get help right away if you are not doing well or get worse.   This information is not intended to replace advice given to you by your health care provider. Make sure you discuss any questions you have with your health care provider.   Document Released: 05/12/2010 Document Revised: 07/02/2011 Document Reviewed: 10/11/2014 Elsevier Interactive Patient Education [employer]2016 Elsevier Inc.

## 2015-07-21 ENCOUNTER — Other Ambulatory Visit: Payer: Self-pay | Admitting: Family Medicine

## 2015-07-21 DIAGNOSIS — E039 Hypothyroidism, unspecified: Secondary | ICD-10-CM

## 2015-07-21 LAB — CMP14+EGFR
A/G RATIO: 1.7 (ref 1.2–2.2)
ALK PHOS: 57 IU/L (ref 39–117)
ALT: 19 IU/L (ref 0–32)
AST: 17 IU/L (ref 0–40)
Albumin: 4.6 g/dL (ref 3.5–5.5)
BUN/Creatinine Ratio: 8 — ABNORMAL LOW (ref 9–23)
BUN: 5 mg/dL — AB (ref 6–24)
CHLORIDE: 100 mmol/L (ref 96–106)
CO2: 22 mmol/L (ref 18–29)
Calcium: 9.4 mg/dL (ref 8.7–10.2)
Creatinine, Ser: 0.62 mg/dL (ref 0.57–1.00)
GFR calc Af Amer: 130 mL/min/{1.73_m2} (ref >59)
GFR calc non Af Amer: 113 mL/min/{1.73_m2} (ref >59)
GLUCOSE: 119 mg/dL — AB (ref 65–99)
Globulin, Total: 2.7 g/dL (ref 1.5–4.5)
POTASSIUM: 3.8 mmol/L (ref 3.5–5.2)
Sodium: 140 mmol/L (ref 134–144)
Total Protein: 7.3 g/dL (ref 6.0–8.5)

## 2015-07-21 LAB — TSH: TSH: 1.5 u[IU]/mL (ref 0.450–4.500)

## 2015-07-21 MED ORDER — LEVOTHYROXINE SODIUM 100 MCG PO TABS: 100.0000 ug | ORAL_TABLET | Freq: Every day | ORAL | Status: DC

## 2015-07-27 ENCOUNTER — Telehealth: Payer: Self-pay | Admitting: Family Medicine

## 2015-07-27 NOTE — Telephone Encounter (Signed)
Per reviewing chart, Dr. Ermalinda MemosBradshaw refilled patient's thyroid medications.

## 2015-07-28 MED ORDER — ALBUTEROL SULFATE HFA 108 (90 BASE) MCG/ACT IN AERS: 2.0000 | INHALATION_SPRAY | Freq: Four times a day (QID) | RESPIRATORY_TRACT | Status: DC | PRN

## 2015-07-28 NOTE — Telephone Encounter (Signed)
Please address

## 2015-07-28 NOTE — Telephone Encounter (Signed)
Re-sent requesting pharmacist to fill any brand covered.   Shirley SinkSam Daquavion Catala, MD [facility] 07/28/2015, 9:11 AM

## 2015-08-08 ENCOUNTER — Ambulatory Visit (INDEPENDENT_AMBULATORY_CARE_PROVIDER_SITE_OTHER): Payer: Medicare Other

## 2015-08-08 ENCOUNTER — Ambulatory Visit (INDEPENDENT_AMBULATORY_CARE_PROVIDER_SITE_OTHER): Payer: Medicare Other | Admitting: Family Medicine

## 2015-08-08 ENCOUNTER — Encounter: Payer: Self-pay | Admitting: Family Medicine

## 2015-08-08 VITALS — BP 131/82 | HR 96 | Temp 98.4°F | Ht 65.0 in | Wt 204.6 lb

## 2015-08-08 DIAGNOSIS — R5382 Chronic fatigue, unspecified: Secondary | ICD-10-CM | POA: Diagnosis not present

## 2015-08-08 DIAGNOSIS — M545 Low back pain, unspecified: Secondary | ICD-10-CM

## 2015-08-08 DIAGNOSIS — H9203 Otalgia, bilateral: Secondary | ICD-10-CM

## 2015-08-08 DIAGNOSIS — E669 Obesity, unspecified: Secondary | ICD-10-CM | POA: Diagnosis not present

## 2015-08-08 DIAGNOSIS — R5383 Other fatigue: Secondary | ICD-10-CM | POA: Insufficient documentation

## 2015-08-08 LAB — BAYER DCA HB A1C WAIVED: HB A1C: 5.4 % (ref <7.0)

## 2015-08-08 MED ORDER — MELOXICAM 7.5 MG PO TABS: 7.5000 mg | ORAL_TABLET | Freq: Every day | ORAL | Status: DC

## 2015-08-08 NOTE — Progress Notes (Addendum)
   HPI  Patient presents today here for ear pain and back pain.  Patient also brings up fatigue.  Describes that she's had generalized fatigue for years. She requests a repeat thyroid test, she believes that her thyroid is low and that's why she has fatigue. She has not started any new medications lately, she does not snore. She has not had her hemoglobin checked but declines my offer to check her hemoglobin today.  Ear pain Intermittent bilateral ear pain described as coming on and leaving after a few  minutes over the last 2 weeks. Using Nasonex for congestion.  Back pain Describes midline low back pain over the last 3-6 months. No radiation Described as dull achy back pain worse with bending, stooping, and doing activities. It's worse at the end of the day.  She denies fever, chills, sweats.   PMH: Smoking status noted ROS: Per HPI  Objective: BP 131/82 mmHg  Pulse 96  Temp(Src) 98.4 F (36.9 C) (Oral)  Ht 5\' 5"  (1.651 m)  Wt 204 lb 9.6 oz (92.806 kg)  BMI 34.05 kg/m2 Gen: NAD, alert, cooperative with exam HEENT: NCAT CV: RRR, good S1/S2, no murmur Resp: CTABL, no wheezes, non-labored Ext: No edema, warm Neuro: Alert and oriented, No gross deficits  Musculoskeletal: Mild tenderness to palpation of lumbar I'm, no tenderness to palpation of paraspinal muscles or SI joints   DG lumbar spin no acute findings, E stim device present  Assessment and plan:  # Back pain Musculoskeletal back pain, I explained that she is young and I doubt that there is much arthritis, however were doing an x-ray to establish a baseline for possible arthritic changes  Physical therapy referral Mobic 7.5 mg, status post bilateral tubal   # Ear pain Likely eustachian tube dysfunction Add Zyrtec to Nasonex  # Fatigue Likely mood related, complex psychiatric situation, could be medication effect She would like an A1c checked, I have done this as she is obese and her blood sugar was  elevated, however was not fasting I recommended a CBC, however she declines this He requests a repeat thyroid tests, I declined this because she just got it checked, however I did offer her an endocrinology appointment if she would like a second opinion     Orders Placed This Encounter  Procedures  . DG Lumbar Spine 2-3 Views    Standing Status: Future     Number of Occurrences:      Standing Expiration Date: 08/07/2016    Order Specific Question:  Reason for Exam (SYMPTOM  OR DIAGNOSIS REQUIRED)    Answer:  back pain    Order Specific Question:  Is the patient pregnant?    Answer:  No    Order Specific Question:  Preferred imaging location?    Answer:  External  . Bayer DCA Hb A1c Waived  . Ambulatory referral to Physical Therapy    Referral Priority:  Routine    Referral Type:  Physical Medicine    Referral Reason:  Specialty Services Required    Requested Specialty:  Physical Therapy    Number of Visits Requested:  1    Meds ordered this encounter  Medications  . meloxicam (MOBIC) 7.5 MG tablet    Sig: Take 1 tablet (7.5 mg total) by mouth daily.    Dispense:  30 tablet    Refill:  1    Murtis SinkSam Bonner Larue, MD [facility] 08/08/2015, 3:27 PM

## 2015-08-08 NOTE — Patient Instructions (Addendum)
Great to see you!  I have ordered an x ray and physical therapy for your back.   Try meloxicam on the days with the worse pain, it lasts 24 hours and should not be combined with ibuprofen or aleve  Back Pain, Adult Back pain is very common in adults.The cause of back pain is rarely dangerous and the pain often gets better over time.The cause of your back pain may not be known. Some common causes of back pain include:  Strain of the muscles or ligaments supporting the spine.  Wear and tear (degeneration) of the spinal disks.  Arthritis.  Direct injury to the back. For many people, back pain may return. Since back pain is rarely dangerous, most people can learn to manage this condition on their own. HOME CARE INSTRUCTIONS Watch your back pain for any changes. The following actions may help to lessen any discomfort you are feeling:  Remain active. It is stressful on your back to sit or stand in one place for long periods of time. Do not sit, drive, or stand in one place for more than 30 minutes at a time. Take short walks on even surfaces as soon as you are able.Try to increase the length of time you walk each day.  Exercise regularly as directed by your health care provider. Exercise helps your back heal faster. It also helps avoid future injury by keeping your muscles strong and flexible.  Do not stay in bed.Resting more than 1-2 days can delay your recovery.  Pay attention to your body when you bend and lift. The most comfortable positions are those that put less stress on your recovering back. Always use proper lifting techniques, including:  Bending your knees.  Keeping the load close to your body.  Avoiding twisting.  Find a comfortable position to sleep. Use a firm mattress and lie on your side with your knees slightly bent. If you lie on your back, put a pillow under your knees.  Avoid feeling anxious or stressed.Stress increases muscle tension and can worsen back  pain.It is important to recognize when you are anxious or stressed and learn ways to manage it, such as with exercise.  Take medicines only as directed by your health care provider. Over-the-counter medicines to reduce pain and inflammation are often the most helpful.Your health care provider may prescribe muscle relaxant drugs.These medicines help dull your pain so you can more quickly return to your normal activities and healthy exercise.  Apply ice to the injured area:  Put ice in a plastic bag.  Place a towel between your skin and the bag.  Leave the ice on for 20 minutes, 2-3 times a day for the first 2-3 days. After that, ice and heat may be alternated to reduce pain and spasms.  Maintain a healthy weight. Excess weight puts extra stress on your back and makes it difficult to maintain good posture. SEEK MEDICAL CARE IF:  You have pain that is not relieved with rest or medicine.  You have increasing pain going down into the legs or buttocks.  You have pain that does not improve in one week.  You have night pain.  You lose weight.  You have a fever or chills. SEEK IMMEDIATE MEDICAL CARE IF:   You develop new bowel or bladder control problems.  You have unusual weakness or numbness in your arms or legs.  You develop nausea or vomiting.  You develop abdominal pain.  You feel faint.   This information is not  intended to replace advice given to you by your health care provider. Make sure you discuss any questions you have with your health care provider.   Document Released: 04/09/2005 Document Revised: 04/30/2014 Document Reviewed: 08/11/2013 Elsevier Interactive Patient Education [employer]2016 Elsevier Inc.

## 2015-08-15 ENCOUNTER — Other Ambulatory Visit: Payer: Self-pay | Admitting: Gastroenterology

## 2015-08-29 ENCOUNTER — Ambulatory Visit: Payer: Self-pay | Admitting: Family Medicine

## 2015-09-02 ENCOUNTER — Ambulatory Visit: Payer: Self-pay | Admitting: Family Medicine

## 2015-09-20 ENCOUNTER — Telehealth: Payer: Self-pay | Admitting: Internal Medicine

## 2015-09-20 NOTE — Telephone Encounter (Signed)
Patient received letter from [employer] about her linzess.  Wanted to verify that it would be covered no matter the dosage.

## 2015-09-22 NOTE — Telephone Encounter (Signed)
Pharmacy has been notified. Pt has ov here on 09/26/15

## 2015-09-22 NOTE — Telephone Encounter (Signed)
Pt has been approved for both the and .

## 2015-09-26 ENCOUNTER — Encounter: Payer: Self-pay | Admitting: Gastroenterology

## 2015-09-26 ENCOUNTER — Ambulatory Visit (INDEPENDENT_AMBULATORY_CARE_PROVIDER_SITE_OTHER): Payer: Medicare Other | Admitting: Gastroenterology

## 2015-09-26 VITALS — BP 132/81 | HR 99 | Temp 99.3°F | Ht 65.0 in | Wt 208.6 lb

## 2015-09-26 DIAGNOSIS — K219 Gastro-esophageal reflux disease without esophagitis: Secondary | ICD-10-CM | POA: Diagnosis not present

## 2015-09-26 DIAGNOSIS — K5909 Other constipation: Secondary | ICD-10-CM

## 2015-09-26 MED ORDER — PROMETHAZINE HCL 25 MG PO TABS: ORAL_TABLET | ORAL | Status: DC

## 2015-09-26 MED ORDER — LINACLOTIDE 145 MCG PO CAPS: 145.0000 ug | ORAL_CAPSULE | Freq: Every day | ORAL | Status: DC

## 2015-09-26 MED ORDER — PROMETHAZINE HCL 25 MG RE SUPP: 25.0000 mg | Freq: Four times a day (QID) | RECTAL | Status: DC | PRN

## 2015-09-26 NOTE — Patient Instructions (Signed)
1. I recommend you have your CBC checked to make sure you are not anemic.  2. Stop omeprazole. Try Dexilant once daily for two weeks to see if this helps you with your nausea.  3. Continue Linzess as before. New RX sent to your pharmacy. 4. RX for phenergan tablets and suppositories sent to your pharmacy. 5. Call if you want to continue Dexilant, otherwise you may resume your omeprazole.  6. Return to the office as needed.

## 2015-09-26 NOTE — Assessment & Plan Note (Signed)
Continue Linzess daily. Return to the office prn but no later than 2 years for refills.

## 2015-09-26 NOTE — Assessment & Plan Note (Signed)
Typical GERD better control but complains of frequent nausea. Unclear that is completely related to reflux, possibly related to depression and/or side effect of medications. Procedure planned for Friday related to her interstitial cystitis. Provided Rx for tablets and suppositories of Phenergan. Switch omeprazole to Dexilant 60mg  daily for 2 weeks. If helpful regarding nausea she will call for Rx. Otherwise if ongoing nausea, consider addressing with PCP/psych. Office visit when necessary.  Labs recently looked good. Requested CBC to rule out anemia as contributing factor.

## 2015-09-26 NOTE — Progress Notes (Signed)
Primary Care Physician: Kevin FentonSamuel Bradshaw, MD  Primary Gastroenterologist:  Roetta SessionsMichael Rourk, MD   Chief Complaint  Patient presents with  . Follow-up    HPI: Shirley PlumpDanielle R Perry is a 40 y.o. female here For follow-up of GERD and constipation. Weight up 35 pounds in past 15 months, contributes to her psychiatric medications. Typical heartburn fairly well-controlled but she complains of frequent nausea. Occasional vomiting. Uses Phenergan as needed but not daily. Requesting Phenergan suppositories in addition to tablets. Recent labs as outlined below. She has not had a recent CBC. Describes heavy menses chronically. No abdominal pain, melena, bright red blood per rectum, dysphagia. Constipation well-managed on linzess.  Current Outpatient Prescriptions  Medication Sig Dispense Refill  . albuterol (PROVENTIL HFA;VENTOLIN HFA) 108 (90 Base) MCG/ACT inhaler Inhale 2 puffs into the lungs every 6 (six) hours as needed for wheezing or shortness of breath. 1 Inhaler 2  . amitriptyline (ELAVIL) 75 MG tablet Take 75 mg by mouth at bedtime.    . diazepam (VALIUM) 10 MG tablet Reported on 04/27/2015  2  . ketoconazole (NIZORAL) 2 % shampoo APPLY TWICE A WEEK AS DIRECTED 120 mL 2  . LATUDA 20 MG TABS tablet Take 20 mg by mouth daily.  1  . levothyroxine (SYNTHROID, LEVOTHROID) 100 MCG tablet Take 1 tablet (100 mcg total) by mouth daily before breakfast. 90 tablet 3  . LINZESS 145 MCG CAPS capsule TAKE 1 CAPSULE BY MOUTH DAILY. 30 capsule 5  . meloxicam (MOBIC) 7.5 MG tablet Take 1 tablet (7.5 mg total) by mouth daily. 30 tablet 1  . mometasone (NASONEX) 50 MCG/ACT nasal spray Place 2 sprays into the nose daily. 17 g 12  . Multiple Vitamins-Minerals (MULTIVITAL) tablet Take 1 tablet by mouth daily.      Marland Kitchen. nystatin cream (MYCOSTATIN) Apply 1 application topically 2 (two) times daily. 30 g 1  . omeprazole (PRILOSEC) 40 MG capsule TAKE 1 CAPSULE BY MOUTH TWICE DAILY 60 capsule 11  . oxybutynin (DITROPAN XL)  15 MG 24 hr tablet Take 5 mg by mouth 3 (three) times daily.     . pentosan polysulfate (ELMIRON) 100 MG capsule Take 100 mg by mouth 3 (three) times daily before meals.      . promethazine (PHENERGAN) 25 MG tablet TAKE 1 TABLET BY MOUTH EVERY SIX HOURS AS NEEDED FOR NAUSEA OR VOMITING 20 tablet 0  . sertraline (ZOLOFT) 25 MG tablet Take 50 mg by mouth daily.      No current facility-administered medications for this visit.    Allergies as of 09/26/2015  . (No Known Allergies)   Past Surgical History  Procedure Laterality Date  . Cholecystectomy  1999  . Colonoscopy  2011    single anal papilla  . Esophagogastroduodenoscopy  2006    patulous EGJ, small hh  . Esophagogastroduodenoscopy  11/10/2010    ZOX:[id number]:normal esophagus/patulous EG junction/small HH  . Tubal ligation Bilateral   . Bladder repair      ROS:  General: Negative for anorexia, weight loss, fever, chills, fatigue, weakness. ENT: Negative for hoarseness, difficulty swallowing , nasal congestion. CV: Negative for chest pain, angina, palpitations, dyspnea on exertion, peripheral edema.  Respiratory: Negative for dyspnea at rest, dyspnea on exertion, cough, sputum, wheezing.  GI: See history of present illness. GU:  Negative for dysuria, hematuria, urinary incontinence, urinary frequency, nocturnal urination.  Endo: Negative for unusual weight change.    Physical Examination:   BP 132/81 mmHg  Pulse 99  Temp(Src) 99.3  F (37.4 C)  Ht  (1.651 m)  Wt 208 lb 9.6 oz (94.62 kg)  BMI 34.71 kg/m2  LMP 09/05/2015 (Approximate)  General: Well-nourished, well-developed in no acute distress.  Eyes: No icterus. Mouth: Oropharyngeal mucosa moist and pink , no lesions erythema or exudate. Lungs: Clear to auscultation bilaterally.  Heart: Regular rate and rhythm, no murmurs rubs or gallops.  Abdomen: Bowel sounds are normal, nontender, nondistended, no hepatosplenomegaly or masses, no abdominal bruits or hernia , no  rebound or guarding.   Extremities: No lower extremity edema. No clubbing or deformities. Neuro: Alert and oriented x 4   Skin: Warm and dry, no jaundice.   Psych: Alert and cooperative, normal mood and affect.  Labs:  Lab Results  Component Value Date   TSH 1.500 07/20/2015   Lab Results  Component Value Date   CREATININE 0.62 07/20/2015   BUN 5* 07/20/2015   NA 140 07/20/2015   K 3.8 07/20/2015   CL 100 07/20/2015   CO2 22 07/20/2015   Lab Results  Component Value Date   ALT 19 07/20/2015   AST 17 07/20/2015   ALKPHOS 57 07/20/2015   BILITOT <0.2 07/20/2015   No results found for: HGBA1C  Imaging Studies: No results found.

## 2015-09-27 ENCOUNTER — Telehealth: Payer: Self-pay | Admitting: Internal Medicine

## 2015-09-27 NOTE — Progress Notes (Signed)
CC'ED TO PCP 

## 2015-09-27 NOTE — Telephone Encounter (Signed)
[phone]   Please call patient, her insurance is refusing to cover the phenagrin suppositories and wondering if anything can be done.

## 2015-09-27 NOTE — Telephone Encounter (Signed)
PA done for suppositories.

## 2015-10-13 ENCOUNTER — Other Ambulatory Visit: Payer: Self-pay | Admitting: Family Medicine

## 2015-10-28 ENCOUNTER — Telehealth: Payer: Self-pay | Admitting: Family Medicine

## 2015-10-28 NOTE — Telephone Encounter (Signed)
She has to be seen to discuss prior to ordering.   US is used to Dx PCOS but either the provider who told her can order it or she can come and let us document clinical signs and symptoms to justify the cost to her insurance company.    Murtis SinkSam Basil Buffin, MD [facility] 10/28/2015, 5:46 PM

## 2015-10-31 ENCOUNTER — Telehealth: Payer: Self-pay | Admitting: Family Medicine

## 2015-10-31 NOTE — Telephone Encounter (Signed)
Pt is aware that she needs an appt. I offered to schedule her an appt while we were on the phone but pt states she would prefer to call back later and schedule.

## 2015-10-31 NOTE — Telephone Encounter (Signed)
Pt is aware that she needs to make an appt. She had 2 phone calls regarding this. Please see other call.

## 2015-11-14 ENCOUNTER — Telehealth: Payer: Self-pay | Admitting: Family Medicine

## 2015-11-14 DIAGNOSIS — E039 Hypothyroidism, unspecified: Secondary | ICD-10-CM

## 2015-11-14 NOTE — Telephone Encounter (Signed)
I have written her referral for endocrinology for hypothyroidism.   Murtis Sink, MD [facility] 11/14/2015, 5:35 PM

## 2016-01-06 ENCOUNTER — Ambulatory Visit: Payer: Self-pay | Admitting: "Endocrinology

## 2016-02-10 ENCOUNTER — Other Ambulatory Visit: Payer: Self-pay | Admitting: Gastroenterology

## 2016-04-09 ENCOUNTER — Ambulatory Visit (INDEPENDENT_AMBULATORY_CARE_PROVIDER_SITE_OTHER): Payer: Medicare Other | Admitting: Family Medicine

## 2016-04-09 ENCOUNTER — Encounter: Payer: Self-pay | Admitting: Family Medicine

## 2016-04-09 VITALS — BP 159/88 | HR 90 | Temp 98.6°F | Ht 65.0 in | Wt 219.4 lb

## 2016-04-09 DIAGNOSIS — G8929 Other chronic pain: Secondary | ICD-10-CM

## 2016-04-09 DIAGNOSIS — E282 Polycystic ovarian syndrome: Secondary | ICD-10-CM

## 2016-04-09 DIAGNOSIS — M2560 Stiffness of unspecified joint, not elsewhere classified: Secondary | ICD-10-CM

## 2016-04-09 DIAGNOSIS — M545 Low back pain, unspecified: Secondary | ICD-10-CM

## 2016-04-09 NOTE — Addendum Note (Signed)
Addended by: Elenora GammaBRADSHAW, Kyrstal Monterrosa L on: 04/09/2016 04:26 PM   Modules accepted: Orders

## 2016-04-09 NOTE — Progress Notes (Signed)
   HPI  Patient presents today  here with back pain, morning stiffness, and PCO S.  Back pain Chronic back pain, seen 8 months ago for similar pain. Describes midline lumbar back pain dull and achy in nature, sometimes has numbness and tingling in the bilateral lateral thighs without any sciatic distribution. No leg weakness or difficulty walking. No bowel or bladder dysfunction or saddle anesthesia. She does not take medication for this.  Patient also complains of morning stiffness in the legs, low back, bilateral arms, this lasts at least half an hour every day, at times it lasts several hours  PCO S Patient states she has a history of PCO S, she has intermittent mild to moderate pelvic pain that resolves by itself. She reports variable low periods bleeding 37 days every 4 weeks, she status post bilateral tubal ligation does not want children. She states overall the pain in her pelvis is serious. Initially she was requesting pelvic ultrasound.  PMH: Smoking status noted ROS: Per HPI  Objective: BP (!) 159/88   Pulse 90   Temp 98.6 F (37 C) (Oral)   Ht 5\' 5"  (1.651 m)   Wt 219 lb 6.4 oz (99.5 kg)   BMI 36.51 kg/m  Gen: NAD, alert, cooperative with exam HEENT: NCAT CV: RRR, good S1/S2, no murmur Resp: CTABL, no wheezes, non-labored Ext: No edema, warm Neuro: Alert and oriented, strength 5/5 and sensation intact in bilateral lower extremities, 1+ symmetric patellar tendon reflexes bilaterally  MSK: No tenderness to palpation of bilateral paraspinal muscles and lumbar area, no midline tenderness in the lumbar spine  Assessment and plan:  # Chronic low back pain No red flags, pain is not improving however not worsening either. No clear radiculopathy, x-ray was reviewed and does not show any signs of degenerative disc disease. Exam is very reassuring Given morning stiffness I have worked her up for inflammatory arthritis, however this is negative would recommend physical  therapy.  # Morning Joint Stiffness Multiple joint stiffness early in the morning lasting at least one half an hour. New complaint Working up for inflammatory arthritis with sedimentation rate and ANA, RF panel  # PCOS Discussed symptoms, the sound self limiting and mild. Discussed that routine pelvic ultrasound is not necessary She is not planning on having children Routine monitoring unless she has additional symptoms.    Orders Placed This Encounter  Procedures  . ANA, IFA Comprehensive Panel  . Sedimentation rate  . Rheumatoid factor     Murtis SinkSam Bradshaw, MD [facility] 04/09/2016, 4:10 PM

## 2016-04-09 NOTE — Patient Instructions (Signed)
Great to see you!  We will call with lab results within 1 week, if they are all negative I would recommend physical therapy to treat long term back pain

## 2016-04-10 LAB — SEDIMENTATION RATE: SED RATE: 26 mm/h (ref 0–32)

## 2016-04-10 NOTE — Addendum Note (Signed)
Addended by: Lorelee CoverOSTOSKY, JESSICA C on: 04/10/2016 11:04 AM   Modules accepted: Orders

## 2016-04-11 LAB — ANA COMPREHENSIVE PANEL
Anti JO-1: <0.2 AI (ref 0.0–0.9)
Centromere Ab Screen: <0.2 AI (ref 0.0–0.9)
Chromatin Ab SerPl-aCnc: 0.3 AI (ref 0.0–0.9)
ENA RNP AB: 0.6 AI (ref 0.0–0.9)
Scleroderma SCL-70: <0.2 AI (ref 0.0–0.9)
dsDNA Ab: <1 IU/mL (ref 0–9)

## 2016-04-11 LAB — RHEUMATOID FACTOR: RHEUMATOID FACTOR: 10 [IU]/mL (ref 0.0–13.9)

## 2016-04-11 LAB — SEDIMENTATION RATE

## 2016-04-25 ENCOUNTER — Ambulatory Visit (INDEPENDENT_AMBULATORY_CARE_PROVIDER_SITE_OTHER): Payer: Medicare Other | Admitting: Pediatrics

## 2016-04-25 ENCOUNTER — Encounter: Payer: Self-pay | Admitting: Pediatrics

## 2016-04-25 VITALS — BP 117/76 | HR 93 | Temp 98.3°F | Ht 65.0 in | Wt 223.8 lb

## 2016-04-25 DIAGNOSIS — F329 Major depressive disorder, single episode, unspecified: Secondary | ICD-10-CM

## 2016-04-25 DIAGNOSIS — R1084 Generalized abdominal pain: Secondary | ICD-10-CM

## 2016-04-25 DIAGNOSIS — G8929 Other chronic pain: Secondary | ICD-10-CM

## 2016-04-25 DIAGNOSIS — F32A Depression, unspecified: Secondary | ICD-10-CM

## 2016-04-25 DIAGNOSIS — M545 Low back pain: Secondary | ICD-10-CM | POA: Diagnosis not present

## 2016-04-25 LAB — URINALYSIS, COMPLETE
BILIRUBIN UA: NEGATIVE
GLUCOSE, UA: NEGATIVE
Ketones, UA: NEGATIVE
LEUKOCYTES UA: NEGATIVE
Nitrite, UA: NEGATIVE
PH UA: 7.5 (ref 5.0–7.5)
PROTEIN UA: NEGATIVE
RBC UA: NEGATIVE
Specific Gravity, UA: 1.01 (ref 1.005–1.030)
UUROB: 0.2 mg/dL (ref 0.2–1.0)

## 2016-04-25 LAB — MICROSCOPIC EXAMINATION: RENAL EPITHEL UA: NONE SEEN /HPF

## 2016-04-25 MED ORDER — DULOXETINE HCL 20 MG PO CPEP: 20.0000 mg | ORAL_CAPSULE | Freq: Every day | ORAL | 1 refills | Status: DC

## 2016-04-25 NOTE — Progress Notes (Signed)
  Subjective:   Patient ID: Shirley Perry, female    DOB: [birth year], 41 y.o.   MRN: [id number] CC: Back Pain; Arm Pain; and Leg Pain  HPI: Shirley Perry is a 41 y.o. female presenting for Back Pain; Arm Pain; and Leg Pain  Has back pain b/l lower back Also has pain in arms in legs Not able to point to any one place where she has pain in arms and legs Back pain is there most of the time She says legs and arms is "on and off" pain Not every day, apprx 4-5 days a week Doesn't do much activity she says, sint sure what makes pain in legs/arms better or worse  Says she was diagnosed by neurologist with fibromyalgia in the past  Depression: sees psychiatrist at [facility] Has appt there next week Wants to try medicine for fibromyalgia  Minimal exercise/walking Limited mostly by back pain Uses heating pad about every day Is not sure if it is helping, does help sometimes  Has occasional pain in her sides, lower abd Sometimes wakes up with it Doesn't think she has dysuria No fevers Appetite has been fine  Relevant past medical, surgical, family and social history reviewed. Allergies and medications reviewed and updated. History  Smoking Status  . Never Smoker  Smokeless Tobacco  . Never Used   ROS: Per HPI   Objective:    BP 117/76   Pulse 93   Temp 98.3 F (36.8 C) (Oral)   Ht _0  (1.651 m)   Wt 223 lb 12.8 oz (101.5 kg)   BMI 37.24 kg/m   Wt Readings from Last 3 Encounters:  04/25/16 223 lb 12.8 oz (101.5 kg)  04/09/16 219 lb 6.4 oz (99.5 kg)  09/26/15 208 lb 9.6 oz (94.6 kg)    Gen: NAD, alert, cooperative with exam, NCAT EYES: EOMI, no conjunctival injection, or no icterus ENT:  TMs pearly gray b/l, OP without erythema LYMPH: no cervical LAD CV: NRRR, normal S1/S2, no murmur, distal pulses 2+ b/l Resp: CTABL, no wheezes, normal WOB Abd: +BS, soft, obese with pannus, mild discomfort with palpation of abd, ND. no guarding or organomegaly Ext: No edema,  warm Neuro: Alert and oriented, strength equal b/l UE and LE, coordination grossly normal MSK: normal ROM shoulders b/l, no TTP over spine, some tenderness over paraspinal muscles, neg SLR b/l  Assessment & Plan:  [name] was seen today for back pain, arm pain and leg pain.  Diagnoses and all orders for this visit:  Chronic bilateral low back pain without sciatica Strongly encouraged PT Pt wants to try cymbalta Take half dose of current SSRI, then start cymbalta -     Ambulatory referral to Physical Therapy -     CBC with Differential/Platelet -     CMP14+EGFR -     DULoxetine (CYMBALTA) 20 MG capsule; Take 1 capsule (20 mg total) by mouth daily.  Abd pain Not present every day Present more in pannus/on skin when she points Not related to eating Check labs, UA Image if not improving -     Urinalysis, Complete -     Urine culture  Depression Follow up with [facility]   Follow up plan: Return in about 2 months (around 06/23/2016). Assunta Found, MD [place]

## 2016-04-25 NOTE — Patient Instructions (Addendum)
I put in referral for physical therapy to help with your back pain Continue to take ibuprofen three times a day and use heating pad as needed Start below exercises/stretches Stop taking the zoloft after taking half tab for 8 days Start cymbalta 20mg  Talk with your psychiatrist about the changes to your medication, she may want you to do something different  Back Exercises Introduction If you have pain in your back, do these exercises 2-3 times each day or as told by your doctor. When the pain goes away, do the exercises once each day, but repeat the steps more times for each exercise (do more repetitions). If you do not have pain in your back, do these exercises once each day or as told by your doctor. Exercises Single Knee to Chest  Do these steps 3-5 times in a row for each leg: 1. Lie on your back on a firm bed or the floor with your legs stretched out. 2. Bring one knee to your chest. 3. Hold your knee to your chest by grabbing your knee or thigh. 4. Pull on your knee until you feel a gentle stretch in your lower back. 5. Keep doing the stretch for 10-30 seconds. 6. Slowly let go of your leg and straighten it. Pelvic Tilt  Do these steps 5-10 times in a row: 1. Lie on your back on a firm bed or the floor with your legs stretched out. 2. Bend your knees so they point up to the ceiling. Your feet should be flat on the floor. 3. Tighten your lower belly (abdomen) muscles to press your lower back against the floor. This will make your tailbone point up to the ceiling instead of pointing down to your feet or the floor. 4. Stay in this position for 5-10 seconds while you gently tighten your muscles and breathe evenly. Cat-Cow  Do these steps until your lower back bends more easily: 1. Get on your hands and knees on a firm surface. Keep your hands under your shoulders, and keep your knees under your hips. You may put padding under your knees. 2. Let your head hang down, and make your  tailbone point down to the floor so your lower back is round like the back of a cat. 3. Stay in this position for 5 seconds. 4. Slowly lift your head and make your tailbone point up to the ceiling so your back hangs low (sags) like the back of a cow. 5. Stay in this position for 5 seconds. Press-Ups  Do these steps 5-10 times in a row: 1. Lie on your belly (face-down) on the floor. 2. Place your hands near your head, about shoulder-width apart. 3. While you keep your back relaxed and keep your hips on the floor, slowly straighten your arms to raise the top half of your body and lift your shoulders. Do not use your back muscles. To make yourself more comfortable, you may change where you place your hands. 4. Stay in this position for 5 seconds. 5. Slowly return to lying flat on the floor. Bridges  Do these steps 10 times in a row: 1. Lie on your back on a firm surface. 2. Bend your knees so they point up to the ceiling. Your feet should be flat on the floor. 3. Tighten your butt muscles and lift your butt off of the floor until your waist is almost as high as your knees. If you do not feel the muscles working in your butt and the back of  your thighs, slide your feet 1-2 inches farther away from your butt. 4. Stay in this position for 3-5 seconds. 5. Slowly lower your butt to the floor, and let your butt muscles relax. If this exercise is too easy, try doing it with your arms crossed over your chest. Belly Crunches  Do these steps 5-10 times in a row: 1. Lie on your back on a firm bed or the floor with your legs stretched out. 2. Bend your knees so they point up to the ceiling. Your feet should be flat on the floor. 3. Cross your arms over your chest. 4. Tip your chin a little bit toward your chest but do not bend your neck. 5. Tighten your belly muscles and slowly raise your chest just enough to lift your shoulder blades a tiny bit off of the floor. 6. Slowly lower your chest and your head  to the floor. Back Lifts  Do these steps 5-10 times in a row: 1. Lie on your belly (face-down) with your arms at your sides, and rest your forehead on the floor. 2. Tighten the muscles in your legs and your butt. 3. Slowly lift your chest off of the floor while you keep your hips on the floor. Keep the back of your head in line with the curve in your back. Look at the floor while you do this. 4. Stay in this position for 3-5 seconds. 5. Slowly lower your chest and your face to the floor. Contact a doctor if:  Your back pain gets a lot worse when you do an exercise.  Your back pain does not lessen 2 hours after you exercise. If you have any of these problems, stop doing the exercises. Do not do them again unless your doctor says it is okay. Get help right away if:  You have sudden, very bad back pain. If this happens, stop doing the exercises. Do not do them again unless your doctor says it is okay. This information is not intended to replace advice given to you by your health care provider. Make sure you discuss any questions you have with your health care provider. Document Released: 05/12/2010 Document Revised: 09/15/2015 Document Reviewed: 06/03/2014  2017 [employer]vier

## 2016-04-26 LAB — CBC WITH DIFFERENTIAL/PLATELET
BASOS ABS: 0 10*3/uL (ref 0.0–0.2)
Basos: 1 %
EOS (ABSOLUTE): 0.1 10*3/uL (ref 0.0–0.4)
Eos: 1 %
Hematocrit: 29.7 % — ABNORMAL LOW (ref 34.0–46.6)
Hemoglobin: 9 g/dL — ABNORMAL LOW (ref 11.1–15.9)
IMMATURE GRANS (ABS): 0 10*3/uL (ref 0.0–0.1)
IMMATURE GRANULOCYTES: 0 %
LYMPHS: 35 %
Lymphocytes Absolute: 1.8 10*3/uL (ref 0.7–3.1)
MCH: 21.1 pg — ABNORMAL LOW (ref 26.6–33.0)
MCHC: 30.3 g/dL — ABNORMAL LOW (ref 31.5–35.7)
MCV: 70 fL — ABNORMAL LOW (ref 79–97)
MONOS ABS: 0.5 10*3/uL (ref 0.1–0.9)
Monocytes: 10 %
NEUTROS PCT: 53 %
Neutrophils Absolute: 2.6 10*3/uL (ref 1.4–7.0)
PLATELETS: 266 10*3/uL (ref 150–379)
RBC: 4.26 x10E6/uL (ref 3.77–5.28)
RDW: 17.7 % — AB (ref 12.3–15.4)
WBC: 5 10*3/uL (ref 3.4–10.8)

## 2016-04-26 LAB — CMP14+EGFR
ALT: 18 IU/L (ref 0–32)
AST: 20 IU/L (ref 0–40)
Albumin/Globulin Ratio: 1.5 (ref 1.2–2.2)
Albumin: 4.4 g/dL (ref 3.5–5.5)
Alkaline Phosphatase: 73 IU/L (ref 39–117)
BUN/Creatinine Ratio: 9 (ref 9–23)
BUN: 5 mg/dL — AB (ref 6–24)
Bilirubin Total: <0.2 mg/dL (ref 0.0–1.2)
CALCIUM: 9 mg/dL (ref 8.7–10.2)
CHLORIDE: 102 mmol/L (ref 96–106)
CO2: 27 mmol/L (ref 18–29)
Creatinine, Ser: 0.57 mg/dL (ref 0.57–1.00)
GFR, EST AFRICAN AMERICAN: 134 mL/min/{1.73_m2} (ref >59)
GFR, EST NON AFRICAN AMERICAN: 116 mL/min/{1.73_m2} (ref >59)
GLUCOSE: 91 mg/dL (ref 65–99)
Globulin, Total: 3 g/dL (ref 1.5–4.5)
POTASSIUM: 4.4 mmol/L (ref 3.5–5.2)
Sodium: 142 mmol/L (ref 134–144)
TOTAL PROTEIN: 7.4 g/dL (ref 6.0–8.5)

## 2016-04-28 LAB — URINE CULTURE

## 2016-05-08 ENCOUNTER — Telehealth: Payer: Self-pay | Admitting: Family Medicine

## 2016-05-10 NOTE — Telephone Encounter (Signed)
Letter sent with lab results.  

## 2016-06-05 ENCOUNTER — Encounter: Payer: Self-pay | Admitting: Pediatrics

## 2016-06-05 ENCOUNTER — Ambulatory Visit (INDEPENDENT_AMBULATORY_CARE_PROVIDER_SITE_OTHER): Payer: Medicare Other | Admitting: Pediatrics

## 2016-06-05 VITALS — BP 135/76 | HR 111 | Temp 98.1°F | Ht 65.0 in | Wt 224.2 lb

## 2016-06-05 DIAGNOSIS — M797 Fibromyalgia: Secondary | ICD-10-CM | POA: Diagnosis not present

## 2016-06-05 DIAGNOSIS — M545 Low back pain, unspecified: Secondary | ICD-10-CM

## 2016-06-05 DIAGNOSIS — G8929 Other chronic pain: Secondary | ICD-10-CM | POA: Diagnosis not present

## 2016-06-05 MED ORDER — GABAPENTIN 300 MG PO CAPS: 300.0000 mg | ORAL_CAPSULE | Freq: Two times a day (BID) | ORAL | 1 refills | Status: DC

## 2016-06-05 NOTE — Patient Instructions (Signed)
Take gabapentin once a day at night for 1 week Then can take in the morning and at night

## 2016-06-05 NOTE — Progress Notes (Signed)
  Subjective:   Patient ID: Shirley Perry, female    DOB: 11-Jan-1976, 41 y.o.   MRN: [id number] CC: Back Pain (& side pain, )  HPI: Shirley PlumpDanielle R Blye is a 41 y.o. female presenting for Back Pain (& side pain, )  Has been trying to do some exercises at home Having lower back pain Tried some exercises at home, thinks that they made her back pain worse at times Pain varies each day Taking ibuprofen 800mg    Taking zoloft now, off of cymbalta Following at [facility] for behavioral health Has been diagnosed with fobromyalgia in the past Says her arms and legs are hurting daily Has a hard time pinpointing where the pain is Not able to describe what pain feels like    Relevant past medical, surgical, family and social history reviewed. Allergies and medications reviewed and updated. History  Smoking Status  . Never Smoker  Smokeless Tobacco  . Never Used   ROS: Per HPI   Objective:    BP 135/76   Pulse (!) 111   Temp 98.1 F (36.7 C) (Oral)   Ht 5\' 5"  (1.651 m)   Wt 224 lb 3.2 oz (101.7 kg)   BMI 37.31 kg/m   Wt Readings from Last 3 Encounters:  06/05/16 224 lb 3.2 oz (101.7 kg)  04/25/16 223 lb 12.8 oz (101.5 kg)  04/09/16 219 lb 6.4 oz (99.5 kg)    Gen: NAD, alert, cooperative with exam, NCAT EYES: EOMI, no conjunctival injection, or no icterus CV: NRRR, normal S1/S2, no murmur, distal pulses 2+ b/l Resp: CTABL, no wheezes, normal WOB Abd: +BS, soft, NTND. no guarding or organomegaly Ext: No edema, warm Neuro: Alert and oriented, strength equal b/l LE, coordination grossly normal, patellar reflex 2+ b/l MSK: no point tenderness over spine, paraspinal muscles b/l  Assessment & Plan:  Duwayne HeckDanielle was seen today for back pain.  Diagnoses and all orders for this visit:  Fibromyalgia Trial of gabapentin Encouraged regular exercising, starting with walking -     gabapentin (NEURONTIN) 300 MG capsule; Take 1 capsule (300 mg total) by mouth 2 (two) times daily.  Chronic  bilateral low back pain without sciatica Cont regularly exercise Cont to encourgae PT Pt very hesitant about PT  Follow up plan: Return in about 4 weeks (around 07/03/2016). Rex Krasarol Dequon Schnebly, MD [facility]

## 2016-06-11 ENCOUNTER — Other Ambulatory Visit: Payer: Self-pay | Admitting: Pediatrics

## 2016-06-11 DIAGNOSIS — M545 Low back pain: Principal | ICD-10-CM

## 2016-06-11 DIAGNOSIS — G8929 Other chronic pain: Secondary | ICD-10-CM

## 2016-06-12 ENCOUNTER — Other Ambulatory Visit: Payer: Self-pay | Admitting: Pediatrics

## 2016-06-12 DIAGNOSIS — M545 Low back pain: Principal | ICD-10-CM

## 2016-06-12 DIAGNOSIS — G8929 Other chronic pain: Secondary | ICD-10-CM

## 2016-06-25 ENCOUNTER — Ambulatory Visit (INDEPENDENT_AMBULATORY_CARE_PROVIDER_SITE_OTHER): Payer: Medicare Other | Admitting: Pediatrics

## 2016-06-25 ENCOUNTER — Encounter: Payer: Self-pay | Admitting: Pediatrics

## 2016-06-25 VITALS — BP 135/85 | HR 98 | Temp 98.1°F | Ht 65.0 in | Wt 226.6 lb

## 2016-06-25 DIAGNOSIS — R739 Hyperglycemia, unspecified: Secondary | ICD-10-CM | POA: Diagnosis not present

## 2016-06-25 DIAGNOSIS — R109 Unspecified abdominal pain: Secondary | ICD-10-CM | POA: Diagnosis not present

## 2016-06-25 DIAGNOSIS — J45909 Unspecified asthma, uncomplicated: Secondary | ICD-10-CM | POA: Diagnosis not present

## 2016-06-25 DIAGNOSIS — M797 Fibromyalgia: Secondary | ICD-10-CM

## 2016-06-25 DIAGNOSIS — D649 Anemia, unspecified: Secondary | ICD-10-CM | POA: Diagnosis not present

## 2016-06-25 DIAGNOSIS — E039 Hypothyroidism, unspecified: Secondary | ICD-10-CM

## 2016-06-25 LAB — BAYER DCA HB A1C WAIVED: HB A1C (BAYER DCA - WAIVED): 5.6 % (ref <7.0)

## 2016-06-25 MED ORDER — ALBUTEROL SULFATE HFA 108 (90 BASE) MCG/ACT IN AERS: 2.0000 | INHALATION_SPRAY | Freq: Four times a day (QID) | RESPIRATORY_TRACT | 2 refills | Status: DC | PRN

## 2016-06-25 MED ORDER — MOMETASONE FUROATE 50 MCG/ACT NA SUSP: 2.0000 | Freq: Every day | NASAL | 12 refills | Status: DC

## 2016-06-25 MED ORDER — LEVOTHYROXINE SODIUM 100 MCG PO TABS: 100.0000 ug | ORAL_TABLET | Freq: Every day | ORAL | 3 refills | Status: DC

## 2016-06-25 MED ORDER — GABAPENTIN 300 MG PO CAPS: ORAL_CAPSULE | ORAL | 2 refills | Status: DC

## 2016-06-25 NOTE — Progress Notes (Signed)
  Subjective:   Patient ID: Shirley Perry, female    DOB: 01-31-1976, 41 y.o.   MRN: [id number] CC: Follow-up (Back Pain)  HPI: Shirley Perry is a 41 y.o. female presenting for Follow-up (Back Pain)  Asthma: using albuterol no more than two times week Feels like breathing has been fine  Fibromyalgia: thinks the gabapentin has been helping Has pain intermittently in her shoulders, back, sides, legs, arms  Anemia: has been taking iron regularly Continues to have heavy periods  Hypothyroidism: taking med daily  Relevant past medical, surgical, family and social history reviewed. Allergies and medications reviewed and updated. History  Smoking Status  . Never Smoker  Smokeless Tobacco  . Never Used   ROS: Per HPI   Objective:    BP 135/85   Pulse 98   Temp 98.1 F (36.7 C) (Oral)   Ht '5\' 5"'$  (1.651 m)   Wt 226 lb 9.6 oz (102.8 kg)   BMI 37.71 kg/m   Wt Readings from Last 3 Encounters:  06/25/16 226 lb 9.6 oz (102.8 kg)  06/05/16 224 lb 3.2 oz (101.7 kg)  04/25/16 223 lb 12.8 oz (101.5 kg)    Gen: NAD, alert, cooperative with exam, NCAT EYES: EOMI, no conjunctival injection, or no icterus ENT: OP without erythema LYMPH: no cervical LAD CV: NRRR, normal S1/S2, no murmur Resp: CTABL, no wheezes, normal WOB Abd: +BS, soft, no tenderness throughout abd with palpation, obese abd. no guarding Ext: No edema, warm Neuro: Alert and oriented MSK: no point tenderness over spine  Assessment & Plan:  Shirley Perry was seen today for follow-up mulitple med problems.  Diagnoses and all orders for this visit:  Hypothyroidism, unspecified type Heavier periods, check TSH, cont med -     levothyroxine (SYNTHROID, LEVOTHROID) 100 MCG tablet; Take 1 tablet (100 mcg total) by mouth daily before breakfast.  Fibromyalgia Increase to '600mg'$  at night, cont '300mg'$  in the morning Not cuasing increased sleepiness, has been helping with symptoms -     gabapentin (NEURONTIN) 300 MG capsule;  Take '300mg'$  in the morning and '600mg'$  at night  Uncomplicated asthma, unspecified asthma severity, unspecified whether persistent Well controlled, cont below -     albuterol (PROVENTIL HFA;VENTOLIN HFA) 108 (90 Base) MCG/ACT inhaler; Inhale 2 puffs into the lungs every 6 (six) hours as needed for wheezing or shortness of breath. -     mometasone (NASONEX) 50 MCG/ACT nasal spray; Place 2 sprays into the nose daily.  Anemia, unspecified type Has been taking iron regularly, cont to have heavy periods, if Hg not improving, ref to gyn -     CBC with Differential/Platelet -     TSH  Hyperglycemia -     Bayer DCA Hb A1c Waived  Side pain Normal exam today -     BMP8+EGFR  Follow up plan: Return in about 2 months (around 08/25/2016). Shirley Found, MD [place]

## 2016-06-25 NOTE — Patient Instructions (Signed)
Rechecking your blood levels because you were low in January. Continue taking the iron.  If still low we will have you see gynycology  Thyroid: we are checking your thyroid level today If we need to change your dose, I will let you know  Rechecking your kidney function labs If side pain gets worse, let me know

## 2016-06-26 ENCOUNTER — Other Ambulatory Visit: Payer: Self-pay | Admitting: Nurse Practitioner

## 2016-06-26 DIAGNOSIS — D509 Iron deficiency anemia, unspecified: Secondary | ICD-10-CM

## 2016-06-26 LAB — BMP8+EGFR
BUN/Creatinine Ratio: 13 (ref 9–23)
BUN: 6 mg/dL (ref 6–24)
CO2: 26 mmol/L (ref 18–29)
Calcium: 8.9 mg/dL (ref 8.7–10.2)
Chloride: 102 mmol/L (ref 96–106)
Creatinine, Ser: 0.45 mg/dL — ABNORMAL LOW (ref 0.57–1.00)
GFR calc Af Amer: 144 mL/min/{1.73_m2} (ref >59)
GFR calc non Af Amer: 125 mL/min/{1.73_m2} (ref >59)
GLUCOSE: 87 mg/dL (ref 65–99)
POTASSIUM: 3.7 mmol/L (ref 3.5–5.2)
SODIUM: 142 mmol/L (ref 134–144)

## 2016-06-26 LAB — CBC WITH DIFFERENTIAL/PLATELET
Basophils Absolute: 0 10*3/uL (ref 0.0–0.2)
Basos: 1 %
EOS (ABSOLUTE): 0.1 10*3/uL (ref 0.0–0.4)
Eos: 1 %
Hematocrit: 27.7 % — ABNORMAL LOW (ref 34.0–46.6)
Hemoglobin: 8.4 g/dL — CL (ref 11.1–15.9)
IMMATURE GRANULOCYTES: 0 %
Immature Grans (Abs): 0 10*3/uL (ref 0.0–0.1)
Lymphocytes Absolute: 1.9 10*3/uL (ref 0.7–3.1)
Lymphs: 41 %
MCH: 20.4 pg — ABNORMAL LOW (ref 26.6–33.0)
MCHC: 30.3 g/dL — ABNORMAL LOW (ref 31.5–35.7)
MCV: 67 fL — ABNORMAL LOW (ref 79–97)
MONOS ABS: 0.6 10*3/uL (ref 0.1–0.9)
Monocytes: 12 %
NEUTROS PCT: 45 %
Neutrophils Absolute: 2.2 10*3/uL (ref 1.4–7.0)
PLATELETS: 276 10*3/uL (ref 150–379)
RBC: 4.11 x10E6/uL (ref 3.77–5.28)
RDW: 17.4 % — AB (ref 12.3–15.4)
WBC: 4.8 10*3/uL (ref 3.4–10.8)

## 2016-06-26 LAB — TSH: TSH: 2.35 u[IU]/mL (ref 0.450–4.500)

## 2016-07-02 ENCOUNTER — Encounter: Payer: Self-pay | Admitting: *Deleted

## 2016-07-03 ENCOUNTER — Telehealth: Payer: Self-pay | Admitting: Family Medicine

## 2016-07-03 NOTE — Telephone Encounter (Signed)
Pt is needing to request a PA for for her NASONEX

## 2016-07-18 ENCOUNTER — Encounter: Payer: Self-pay | Admitting: Obstetrics and Gynecology

## 2016-07-20 ENCOUNTER — Other Ambulatory Visit: Payer: Self-pay | Admitting: Family Medicine

## 2016-08-02 ENCOUNTER — Encounter: Payer: Self-pay | Admitting: Pediatrics

## 2016-08-02 ENCOUNTER — Ambulatory Visit (INDEPENDENT_AMBULATORY_CARE_PROVIDER_SITE_OTHER): Payer: Medicare Other | Admitting: Pediatrics

## 2016-08-02 VITALS — BP 133/89 | HR 72 | Temp 98.9°F | Ht 65.0 in | Wt 223.0 lb

## 2016-08-02 DIAGNOSIS — R631 Polydipsia: Secondary | ICD-10-CM | POA: Diagnosis not present

## 2016-08-02 DIAGNOSIS — M5442 Lumbago with sciatica, left side: Secondary | ICD-10-CM

## 2016-08-02 DIAGNOSIS — M797 Fibromyalgia: Secondary | ICD-10-CM

## 2016-08-02 DIAGNOSIS — D509 Iron deficiency anemia, unspecified: Secondary | ICD-10-CM

## 2016-08-02 DIAGNOSIS — M5441 Lumbago with sciatica, right side: Secondary | ICD-10-CM

## 2016-08-02 LAB — MICROSCOPIC EXAMINATION
Epithelial Cells (non renal): >10 /hpf — AB (ref 0–10)
RBC, UA: NONE SEEN /hpf (ref 0–?)
Renal Epithel, UA: NONE SEEN /hpf

## 2016-08-02 LAB — URINALYSIS, COMPLETE
Bilirubin, UA: NEGATIVE
GLUCOSE, UA: NEGATIVE
Ketones, UA: NEGATIVE
Leukocytes, UA: NEGATIVE
Nitrite, UA: NEGATIVE
PROTEIN UA: NEGATIVE
RBC, UA: NEGATIVE
Specific Gravity, UA: 1.01 (ref 1.005–1.030)
Urobilinogen, Ur: 0.2 mg/dL (ref 0.2–1.0)
pH, UA: 7.5 (ref 5.0–7.5)

## 2016-08-02 MED ORDER — GABAPENTIN 300 MG PO CAPS: ORAL_CAPSULE | ORAL | 3 refills | Status: DC

## 2016-08-02 NOTE — Progress Notes (Signed)
Subjective:   Patient ID: Shirley Perry, female    DOB: 1976-01-14, 41 y.o.   MRN: [id number] CC: Follow-up (Back pain)  HPI: Shirley Perry is a 41 y.o. female presenting for Follow-up (Back pain)  Fibromyalgia: Thinks the gabapentin is helping some with pain in different places she says, some of low back pain is better Continues to have pain lower ribs b/l when she presses on them  Low back Pain: points to sacrum area Says muscles nerves and bones hurting Has a prickly feeling in her feet that comes and goes, has been there for years she says Thinks the gabapentin is helping some Has pain shooting down her legs  Has periods every month Last for about 5 days, sometimes heavy, sometimes lighter Declined gyn c/s when sent last month after finding hg of 8.4, MCV 67 Pt says she does not want to see them, doesn't think anything is wrong Has not noticed any bleeding in stools, no dark colored stools  Asks for ultasound for her kidneys, had Abd u/s in 2007 that was normal Says she is worried about her kidneys Normal creatinine  Asks about evaluation for DM2 Had A1c done 1 month ago that was normal at 5.6 Says she is worried about it because she is thirsty much of the time Takes oxybutynin daily  Lives with her 18yo daughter  Follows with psychiatry, Gaynelle Arabian  Relevant past medical, surgical, family and social history reviewed. Allergies and medications reviewed and updated. History  Smoking Status  . Never Smoker  Smokeless Tobacco  . Never Used   ROS: Per HPI   Objective:    BP 133/89   Pulse 72   Temp 98.9 F (37.2 C) (Oral)   Ht  (1.651 m)   Wt 223 lb (101.2 kg)   BMI 37.11 kg/m   Wt Readings from Last 3 Encounters:  08/02/16 223 lb (101.2 kg)  06/25/16 226 lb 9.6 oz (102.8 kg)  06/05/16 224 lb 3.2 oz (101.7 kg)    Gen: NAD, alert, cooperative with exam, NCAT EYES: EOMI, no conjunctival injection, or no icterus ENT:  TMs pearly gray b/l, OP  without erythema LYMPH: no cervical LAD CV: NRRR, normal S1/S2, no murmur, distal pulses 2+ b/l Resp: CTABL, no wheezes, normal WOB Abd: +BS, soft, NTND. no guarding or organomegaly Ext: No edema, warm Neuro: Alert and oriented, strength equal b/l UE and LE, coordination grossly normal MSK: normal muscle bulk Psych: normal affect, good eye contact, multiple second pauses before answering questions, minimal words used to answer all questions  Assessment & Plan:  Shirley Perry was seen today for follow-up multiple med problems  Diagnoses and all orders for this visit:  Fibromyalgia Ongoing symptoms Some of which likely related to iron def anemia Discussed regular activity Gabapentin helping, wants to be increased Increase to  BID -     gabapentin (NEURONTIN) 300 MG capsule; Take  in the morning and  at night  Low back pain with bilateral sciatica, unspecified back pain laterality, unspecified chronicity Discussed regular stretches/exercises UA nl -     Urinalysis, Complete  Always thirsty Likely related to oxybutynin A1c 5.6 last month, no glu in urine today  Iron deficiency anemia, unspecified iron deficiency anemia type Hg 8.4, was referred to gyn for heavy periods last visit Pt declined ref when called with appt, says she does not believe she has low iron levels, declines additional testing today Discussed potential future danger of low iron levels if cause  not discovered and treated. Pt voiced understanding, declined to let me talk with her psychiatrist  Other orders -     Microscopic Examination   Follow up plan: Return in about 3 months (around 11/01/2016). Rex Kras, MD [place] [facility]

## 2016-08-04 DIAGNOSIS — M5442 Lumbago with sciatica, left side: Secondary | ICD-10-CM | POA: Insufficient documentation

## 2016-08-04 DIAGNOSIS — D509 Iron deficiency anemia, unspecified: Secondary | ICD-10-CM | POA: Insufficient documentation

## 2016-08-04 DIAGNOSIS — M797 Fibromyalgia: Secondary | ICD-10-CM | POA: Insufficient documentation

## 2016-08-04 DIAGNOSIS — M5441 Lumbago with sciatica, right side: Secondary | ICD-10-CM | POA: Insufficient documentation

## 2016-08-16 ENCOUNTER — Telehealth: Payer: Self-pay

## 2016-08-16 NOTE — Telephone Encounter (Signed)
nasonex PA denied, recommend OTC flonase.   Murtis Sink, MD [facility] 08/16/2016, 10:11 AM

## 2016-08-20 ENCOUNTER — Telehealth: Payer: Self-pay

## 2016-08-20 NOTE — Telephone Encounter (Signed)
Pt called- left a voicemail-  she wants to know if [facility] will increase her linzess to .

## 2016-08-21 ENCOUNTER — Telehealth: Payer: Self-pay | Admitting: Family Medicine

## 2016-08-21 MED ORDER — LINACLOTIDE 290 MCG PO CAPS: 290.0000 ug | ORAL_CAPSULE | Freq: Every day | ORAL | 5 refills | Status: DC

## 2016-08-21 NOTE — Telephone Encounter (Signed)
Pt is aware.  

## 2016-08-21 NOTE — Telephone Encounter (Signed)
I have sent in rx for 

## 2016-09-10 ENCOUNTER — Ambulatory Visit (INDEPENDENT_AMBULATORY_CARE_PROVIDER_SITE_OTHER): Payer: Medicare Other | Admitting: Pediatrics

## 2016-09-10 ENCOUNTER — Telehealth: Payer: Self-pay | Admitting: Pediatrics

## 2016-09-10 ENCOUNTER — Encounter: Payer: Self-pay | Admitting: Pediatrics

## 2016-09-10 VITALS — BP 130/81 | HR 77 | Temp 97.3°F | Ht 65.0 in | Wt 231.8 lb

## 2016-09-10 DIAGNOSIS — M797 Fibromyalgia: Secondary | ICD-10-CM

## 2016-09-10 DIAGNOSIS — G8929 Other chronic pain: Secondary | ICD-10-CM | POA: Diagnosis not present

## 2016-09-10 DIAGNOSIS — M5442 Lumbago with sciatica, left side: Secondary | ICD-10-CM | POA: Diagnosis not present

## 2016-09-10 DIAGNOSIS — J45909 Unspecified asthma, uncomplicated: Secondary | ICD-10-CM | POA: Diagnosis not present

## 2016-09-10 DIAGNOSIS — D649 Anemia, unspecified: Secondary | ICD-10-CM

## 2016-09-10 DIAGNOSIS — J309 Allergic rhinitis, unspecified: Secondary | ICD-10-CM

## 2016-09-10 MED ORDER — LORATADINE 10 MG PO TABS: 10.0000 mg | ORAL_TABLET | Freq: Every day | ORAL | 11 refills | Status: DC

## 2016-09-10 MED ORDER — GABAPENTIN 300 MG PO CAPS: ORAL_CAPSULE | ORAL | 2 refills | Status: DC

## 2016-09-10 MED ORDER — ALBUTEROL SULFATE HFA 108 (90 BASE) MCG/ACT IN AERS: 2.0000 | INHALATION_SPRAY | Freq: Four times a day (QID) | RESPIRATORY_TRACT | 2 refills | Status: DC | PRN

## 2016-09-10 MED ORDER — GABAPENTIN 300 MG PO CAPS: ORAL_CAPSULE | ORAL | 3 refills | Status: DC

## 2016-09-10 NOTE — Telephone Encounter (Signed)
Per Dr Oswaldo DoneVincent - we fixed the rx to say 600 mg TID - pt aware by detailed vm

## 2016-09-10 NOTE — Progress Notes (Signed)
Subjective:   Patient ID: Shirley Perry, female    DOB: [birth year], 41 y.o.   MRN: [id number] CC: Redness hands and feet  HPI: Shirley Perry is a 41 y.o. female presenting for Redness hands and feet  Gets redness and pain on bottoms of feet and palms intermittently Usually happens when up and moving around Has to sit down when it happens  Has happened when sitting already once Happens about 4 times a week Last happened two days ago Was walking and moving around, started hurting, had to sit down for 30 min Usually back to normal after the thirty minutes Anemic last CBC, has declined treatment, further work up, gyn referral or repeat labs. Says she is still not interested now.  Bothered with back pain Has bene ongoing problem Starts in low back, shoots down to her feet.  Fibromyalgia: thinks the gabapentin has been helping some   Says asthma has been worsened past 1-2 weeks with increased temperatures outside, has been coughing more  Relevant past medical, surgical, family and social history reviewed. Allergies and medications reviewed and updated. History  Smoking Status  . Never Smoker  Smokeless Tobacco  . Never Used   ROS: Per HPI   Objective:    BP 130/81   Pulse 77   Temp 97.3 F (36.3 C) (Oral)   Ht 5\' 5"  (1.651 m)   Wt 231 lb 12.8 oz (105.1 kg)   BMI 38.57 kg/m   Wt Readings from Last 3 Encounters:  09/10/16 231 lb 12.8 oz (105.1 kg)  08/02/16 223 lb (101.2 kg)  06/25/16 226 lb 9.6 oz (102.8 kg)    Gen: NAD, alert, cooperative with exam, NCAT EYES: EOMI, no conjunctival injection, or no icterus ENT:   OP without erythema CV: NRRR, normal S1/S2, no murmur, distal pulses 2+ b/l Resp: CTABL, no wheezes, normal WOB Ext: No edema, warm Neuro: Alert and oriented, strength equal b/l LE with flex/ext at knee, sensation intact b/l LE MSK: no point tenderness over spine Skin: normal appearance to hands, feet Psych: normal affect, pauses before answering  questions, sometimes questions have to be repeated, answers appropriately   Assessment & Plan:  Shirley Perry was seen today for redness hands and feet.  Diagnoses and all orders for this visit:  Chronic left-sided low back pain with left-sided sciatica Continues to be bothered by low back pain with sciatica Has not responded to NSAIDs, rest, back exercises -     Ambulatory referral to Orthopedic Surgery  Fibromyalgia Ongoing symptoms, cont gabapentin, increase to 600mg  at night -     gabapentin (NEURONTIN) 300 MG capsule; Take 600mg  in the morning and 600mg  at night -     Discontinue: gabapentin (NEURONTIN) 300 MG capsule; Take 600mg  in the morning and 600mg  at night  Uncomplicated asthma, unspecified asthma severity, unspecified whether persistent No wheezing today Take allergy meds Use albuterol as needed, if needing frequently let me know -     albuterol (PROVENTIL HFA;VENTOLIN HFA) 108 (90 Base) MCG/ACT inhaler; Inhale 2 puffs into the lungs every 6 (six) hours as needed for wheezing or shortness of breath.  Allergic rhinitis, unspecified seasonality, unspecified trigger -     loratadine (CLARITIN) 10 MG tablet; Take 1 tablet (10 mg total) by mouth daily.  Anemia, unspecified type Hg 8.4 last check 3 mo ago, declined further workup, OCP, referral to gyn Declines dark colored stools Does have heavy periods Declined recheck Hg now Discussed risk, may be contributing to above problems  Follow up plan: 2 mo Rex Kras, MD [place] [facility]

## 2016-09-10 NOTE — Patient Instructions (Signed)
Gabapentin 600mg  three times a day  Claritin 10mg  once a day for allergies  Let me know if breathing symptoms continue

## 2016-09-12 DIAGNOSIS — G8929 Other chronic pain: Secondary | ICD-10-CM | POA: Insufficient documentation

## 2016-09-12 DIAGNOSIS — D649 Anemia, unspecified: Secondary | ICD-10-CM | POA: Insufficient documentation

## 2016-09-12 DIAGNOSIS — M5442 Lumbago with sciatica, left side: Principal | ICD-10-CM

## 2016-09-21 ENCOUNTER — Telehealth: Payer: Self-pay | Admitting: Family Medicine

## 2016-09-21 NOTE — Telephone Encounter (Signed)
Pt wanting to know what lab tests are done for an arthritis panel. Told her of the ones that were done back in December 2017.

## 2016-10-10 ENCOUNTER — Ambulatory Visit: Payer: Medicare Other | Admitting: Gastroenterology

## 2016-11-04 ENCOUNTER — Other Ambulatory Visit: Payer: Self-pay | Admitting: Pediatrics

## 2016-11-04 DIAGNOSIS — M797 Fibromyalgia: Secondary | ICD-10-CM

## 2016-11-23 ENCOUNTER — Telehealth: Payer: Self-pay | Admitting: Family Medicine

## 2016-11-23 NOTE — Telephone Encounter (Signed)
Patient aware of what complete physical consist of

## 2016-11-28 ENCOUNTER — Ambulatory Visit: Payer: Medicare Other | Admitting: Gastroenterology

## 2016-11-28 ENCOUNTER — Other Ambulatory Visit: Payer: Self-pay | Admitting: Physical Medicine and Rehabilitation

## 2016-11-28 ENCOUNTER — Other Ambulatory Visit: Payer: Self-pay | Admitting: Pediatrics

## 2016-11-28 DIAGNOSIS — M5126 Other intervertebral disc displacement, lumbar region: Secondary | ICD-10-CM

## 2016-11-28 DIAGNOSIS — J45909 Unspecified asthma, uncomplicated: Secondary | ICD-10-CM

## 2016-12-06 ENCOUNTER — Ambulatory Visit
Admission: RE | Admit: 2016-12-06 | Discharge: 2016-12-06 | Disposition: A | Discharge status: Home / Self Care | Payer: Medicare Other | Source: Ambulatory Visit | Attending: Physical Medicine and Rehabilitation | Admitting: Physical Medicine and Rehabilitation

## 2016-12-06 DIAGNOSIS — M5126 Other intervertebral disc displacement, lumbar region: Secondary | ICD-10-CM

## 2016-12-06 MED ORDER — IOPAMIDOL (ISOVUE-M 200) INJECTION 41%
1.0000 mL | Freq: Once | INTRAMUSCULAR | Status: AC
  Administered 2016-12-06: 1 mL via EPIDURAL

## 2016-12-06 MED ORDER — METHYLPREDNISOLONE ACETATE 40 MG/ML INJ SUSP (RADIOLOG
120.0000 mg | Freq: Once | INTRAMUSCULAR | Status: AC
  Administered 2016-12-06: 120 mg via EPIDURAL

## 2016-12-06 NOTE — Discharge Instructions (Signed)

## 2017-01-10 ENCOUNTER — Ambulatory Visit (INDEPENDENT_AMBULATORY_CARE_PROVIDER_SITE_OTHER): Payer: Medicare Other | Admitting: Gastroenterology

## 2017-01-10 ENCOUNTER — Encounter: Payer: Self-pay | Admitting: Gastroenterology

## 2017-01-10 VITALS — BP 137/88 | HR 107 | Temp 97.2°F | Ht 65.0 in | Wt 235.8 lb

## 2017-01-10 DIAGNOSIS — D649 Anemia, unspecified: Secondary | ICD-10-CM

## 2017-01-10 DIAGNOSIS — K5900 Constipation, unspecified: Secondary | ICD-10-CM

## 2017-01-10 DIAGNOSIS — R1013 Epigastric pain: Secondary | ICD-10-CM | POA: Insufficient documentation

## 2017-01-10 NOTE — Patient Instructions (Signed)
1. Please collect stool specimen and return to our office.  2. Please have your labs done as soon as possible. 3. Further recommendations to follow results.  4. If you have black stools, shortness of breath, lightheadedness, go to the nearest emergency room.

## 2017-01-10 NOTE — Progress Notes (Signed)
Primary Care Physician: Kirstie Peri, MD  Primary Gastroenterologist:  Roetta Sessions, MD   Chief Complaint  Patient presents with  . Abdominal Pain    HPI: Shirley Perry is a 41 y.o. female hereFor follow-up of GERD and constipation. We requested her to come back for further medication refills as we haven't seen her since June 2017. Her reflux has been well controlled on omeprazole 20 mg twice a day. Constipation managed with Linzess daily.   Her weight is up 40 pounds over the past 2 years. She contributes this to her psychiatric medication. Upon entering the room I noticed the patient was quite pale. Upon review of labs available in Epic back in March her hemoglobin was 8.4, MCV 60. She had been encouraged by her PCP to see gynecology back in March for heavy periods. Patient did not follow through. Bleeding 5-7 days every month. Patient really cannot or will not go into detail about her menstrual cycles. She doesn't know when her last one was, stating she has one month. She's had one this month.   She states she is always tired, she is always for life. She really didn't think anything was different. Sometimes she gets a little lightheaded when she stands up too quick. She takes 2 iron a day. From a GI standpoint she continues to have nausea, little more than usual. She complains of swelling in the epigastrium which is intermittent. Sometimes with meals but not always. Currently having a bowel movement every day using linzess and stool softner. No melena or rectal bleeding. She states she takes aspirin daily, ibuprofen on occasion.  Last colonoscopy and EGD in 2011/2012 as outlined below.  Current Outpatient Prescriptions  Medication Sig Dispense Refill  . amitriptyline (ELAVIL) 75 MG tablet Take 75 mg by mouth at bedtime.    . diazepam (VALIUM) 10 MG tablet Reported on 04/27/2015  2  . gabapentin (NEURONTIN) 300 MG capsule TAKE TWO CAPSULES BY MOUTH THREE TIMES DAILY 180  capsule 0  . LATUDA 20 MG TABS tablet Take 20 mg by mouth daily.  1  . levothyroxine (SYNTHROID, LEVOTHROID) 100 MCG tablet Take 1 tablet (100 mcg total) by mouth daily before breakfast. 90 tablet 3  . linaclotide (LINZESS) 290 MCG CAPS capsule Take 1 capsule (290 mcg total) by mouth daily before breakfast. 30 capsule 5  . loratadine (CLARITIN) 10 MG tablet Take 1 tablet (10 mg total) by mouth daily. 30 tablet 11  . Multiple Vitamins-Minerals (MULTIVITAL) tablet Take 1 tablet by mouth daily.      Marland Kitchen nystatin cream (MYCOSTATIN) APPLY TO EXTERNAL AFFECTED AREA(S) TWICE DAILY 30 g 2  . omeprazole (PRILOSEC) 40 MG capsule TAKE 1 CAPSULE BY MOUTH TWICE DAILY 60 capsule 11  . oxybutynin (DITROPAN-XL) 10 MG 24 hr tablet TAKE 1 TABLET BY MOUTH TWICE DAILY    . pentosan polysulfate (ELMIRON) 100 MG capsule Take 100 mg by mouth 3 (three) times daily before meals.      . promethazine (PHENERGAN) 25 MG suppository Place 1 suppository (25 mg total) rectally every 6 (six) hours as needed for nausea or vomiting. 30 each 0  . promethazine (PHENERGAN) 25 MG tablet TAKE 1 TABLET BY MOUTH EVERY SIX HOURS AS NEEDED FOR NAUSEA OR VOMITING 30 tablet 0  . sertraline (ZOLOFT) 50 MG tablet Take 1 tablet by mouth daily.  1  . VENTOLIN HFA 108 (90 Base) MCG/ACT inhaler INHALE TWO PUFFS EVERY 6 HOURS AS NEEDED WHEEZING SHORTNESS OF  BREATH 18 g 2   No current facility-administered medications for this visit.     Allergies as of 01/10/2017  . (No Known Allergies)   Past Medical History:  Diagnosis Date  . Allergic rhinitis   . Asthma   . Chronic constipation   . Depression   . GERD (gastroesophageal reflux disease)   . Hiatal hernia   . Hypothyroidism   . Interstitial cystitis   . Irritable bowel syndrome   . Obesity   . OSA on CPAP   . Overactive bladder   . Paranoia (HCC)   . Psoriasis    Past Surgical History:  Procedure Laterality Date  . BLADDER REPAIR    . CHOLECYSTECTOMY  1999  . COLONOSCOPY  2011     single anal papilla  . ESOPHAGOGASTRODUODENOSCOPY  2006   patulous EGJ, small hh  . ESOPHAGOGASTRODUODENOSCOPY  11/10/2010   WUJ:[id number] esophagus/patulous EG junction/small HH  . TUBAL LIGATION Bilateral    Family History  Problem Relation Age of Onset  . Ulcers Mother   . Breast cancer Mother   . Diabetes Father   . Sleep apnea Sister   . Anesthesia problems Neg Hx   . Hypotension Neg Hx   . Malignant hyperthermia Neg Hx   . Pseudochol deficiency Neg Hx    Social History  Substance Use Topics  . Smoking status: Never Smoker  . Smokeless tobacco: Never Used  . Alcohol use No    ROS:  General: Negative for anorexia, weight loss, fever, chills, Positive chronic fatigue, weakness. ENT: Negative for hoarseness, difficulty swallowing , nasal congestion. CV: Negative for chest pain, angina, palpitations, dyspnea on exertion, peripheral edema.  Respiratory: Negative for dyspnea at rest, dyspnea on exertion, cough, sputum, wheezing.  GI: See history of present illness. GU:  Negative for dysuria, hematuria, urinary incontinence, urinary frequency, nocturnal urination.  Endo: Negative for unusual weight change.    Physical Examination:   BP 137/88   Pulse (!) 107   Temp (!) 97.2 F (36.2 C) (Oral)   Ht  (1.651 m)   Wt 235 lb 12.8 oz (107 kg)   BMI 39.24 kg/m   General: Well-nourished, well-developed in no acute distress. Pale. Eyes: No icterus. Conjunctiva pale. Mouth: Oropharyngeal mucosa moist and pink , no lesions erythema or exudate. Lungs: Clear to auscultation bilaterally.  Heart: Regular rate and rhythm, no murmurs rubs or gallops.  Abdomen: Bowel sounds are normal, nontender, nondistended, no hepatosplenomegaly or masses, no abdominal bruits or hernia , no rebound or guarding.   Extremities: No lower extremity edema. No clubbing or deformities. Neuro: Alert and oriented x 4   Skin: Warm and dry, no jaundice.   Psych: Alert and cooperative, normal mood and  affect.  Labs:  Lab Results  Component Value Date   CREATININE 0.45 (L) 06/25/2016   BUN 6 06/25/2016   NA 142 06/25/2016   K 3.7 06/25/2016   CL 102 06/25/2016   CO2 26 06/25/2016   Lab Results  Component Value Date   ALT 18 04/25/2016   AST 20 04/25/2016   ALKPHOS 73 04/25/2016   BILITOT <0.2 04/25/2016   Lab Results  Component Value Date   WBC 4.8 06/25/2016   HGB 8.4 (<) 06/25/2016   HCT 27.7 (L) 06/25/2016   MCV 67 (L) 06/25/2016   PLT 276 06/25/2016   No results found for: IRON, TIBC, FERRITIN No results found for: VITAMINB12 No results found for: FOLATE  Imaging Studies: No results found.

## 2017-01-11 NOTE — Assessment & Plan Note (Signed)
Clinically stable on Linzess daily and stool softener.

## 2017-01-11 NOTE — Progress Notes (Signed)
CC'D TO PCP °

## 2017-01-11 NOTE — Assessment & Plan Note (Signed)
Vague epigastric swelling/discomfort, sometimes related to meals. Typical GERD well controlled. Increase in nausea from baseline. Takes aspirin daily. Occasional ibuprofen. Cannot exclude gastritis/peptic ulcer disease/other etiology.  Interestingly she has significant microcytic anemia noted earlier this year, possibly related to heavy menses. Today I am having difficulty obtaining history from the patient regarding her menses, she is quite resistant to any discussion or workup of her anemia. I discussed with her that we needed to exclude chronic GI blood loss. We will check I FOBT. Check iron studies, celiac screen. Cannot exclude possibility of colonoscopy and upper endoscopy. Also encouraged her to see her gynecologist. Further recommendations to follow.

## 2017-01-12 ENCOUNTER — Other Ambulatory Visit: Payer: Self-pay | Admitting: Pediatrics

## 2017-01-14 NOTE — Telephone Encounter (Signed)
Last seen 09/10/16  Dr Oswaldo Done

## 2017-01-23 ENCOUNTER — Other Ambulatory Visit: Payer: Self-pay | Admitting: Gastroenterology

## 2017-02-26 ENCOUNTER — Encounter: Payer: Self-pay | Admitting: Gastroenterology

## 2017-03-20 ENCOUNTER — Telehealth: Payer: Self-pay | Admitting: Internal Medicine

## 2017-03-20 NOTE — Telephone Encounter (Signed)
[phone]  Please call patient, she wanted to know if her lab results were in

## 2017-03-21 ENCOUNTER — Telehealth: Payer: Self-pay

## 2017-03-21 NOTE — Telephone Encounter (Signed)
Shirley Perry, results aren't in at this time. Pt will be called when results come in .

## 2017-03-21 NOTE — Telephone Encounter (Signed)
Scheduled appt and left message on her voice mail

## 2017-03-21 NOTE — Telephone Encounter (Signed)
Patient seen 9/20 and asked to do the labs and ifobt ASAP.  She had labs done at [facility] 02/26/17 and labs received via fax today.  She has not completed ifobt to my knowledge, it is not in our system.   Please let her know that her Hgb is still low at 8.0 but stable compared to 06/2016. Microcytic and iron indices/ferritin consistent with IDA. Minimally elevated lipase, alk phos is nonspecific. Celiac screen is negative.   IDA may be secondary to heavy menses but I would suggest GI work up to include TCS/EGD in OR with RMR. Since she has not been seen since 12/2016, she will need f/u ov per protocol.   Please offer her an appt in 2-3 weeks, may use urgent.  Please have her return ifobt.

## 2017-03-21 NOTE — Telephone Encounter (Signed)
Pt called to inquire about her lab results. [facility] were [facility].   [facility], results were faxed and placed on your desk. Please advise when results are reviewed.

## 2017-03-21 NOTE — Telephone Encounter (Signed)
Spoke with pt, pt notified of results. Pt stated she mailed her IFOBT back to our office. After speaking with LSL, pt needs to repeat the IFOBT and bring it back in the office the day of her f/u in 2-3 week. Ok to mail IFOBT per LSL.  Please call pt to schedule 2-3 week apt, may use urgent per LSL

## 2017-03-25 ENCOUNTER — Telehealth: Payer: Self-pay

## 2017-03-25 NOTE — Telephone Encounter (Signed)
Pt called to ask were we going to mail her IFOBT. Pt notified that her IFOBT was put in the mail when we discussed last week.

## 2017-04-10 ENCOUNTER — Encounter: Payer: Self-pay | Admitting: Gastroenterology

## 2017-04-10 ENCOUNTER — Ambulatory Visit (INDEPENDENT_AMBULATORY_CARE_PROVIDER_SITE_OTHER): Payer: Medicare Other | Admitting: Gastroenterology

## 2017-04-10 DIAGNOSIS — D649 Anemia, unspecified: Secondary | ICD-10-CM | POA: Diagnosis not present

## 2017-04-10 LAB — IFOBT (OCCULT BLOOD): IMMUNOLOGICAL FECAL OCCULT BLOOD TEST: NEGATIVE

## 2017-04-18 ENCOUNTER — Ambulatory Visit: Payer: Self-pay | Admitting: Gastroenterology

## 2017-04-27 NOTE — Progress Notes (Signed)
Please tell patient to keep upcoming appt to schedule TCS+/-EGD for IDA.  ifobt was negative.

## 2017-05-23 ENCOUNTER — Ambulatory Visit: Payer: Self-pay | Admitting: Gastroenterology

## 2017-05-23 ENCOUNTER — Telehealth: Payer: Self-pay | Admitting: *Deleted

## 2017-05-23 ENCOUNTER — Encounter: Payer: Self-pay | Admitting: Gastroenterology

## 2017-05-23 ENCOUNTER — Ambulatory Visit (INDEPENDENT_AMBULATORY_CARE_PROVIDER_SITE_OTHER): Payer: Medicare Other | Admitting: Gastroenterology

## 2017-05-23 ENCOUNTER — Other Ambulatory Visit: Payer: Self-pay | Admitting: *Deleted

## 2017-05-23 VITALS — BP 148/85 | HR 99 | Temp 96.8°F | Ht 65.0 in | Wt 231.2 lb

## 2017-05-23 DIAGNOSIS — R1906 Epigastric swelling, mass or lump: Secondary | ICD-10-CM

## 2017-05-23 DIAGNOSIS — D509 Iron deficiency anemia, unspecified: Secondary | ICD-10-CM

## 2017-05-23 DIAGNOSIS — R1900 Intra-abdominal and pelvic swelling, mass and lump, unspecified site: Secondary | ICD-10-CM

## 2017-05-23 NOTE — Patient Instructions (Addendum)
1. CT scan of abdomen as planned. Once completed, we will move towards colonoscopy and upper endoscopy to evaluate your iron deficiency anemia.  2. You will need labs prior to your CT scan.

## 2017-05-23 NOTE — Progress Notes (Signed)
Primary Care Physician:  Kirstie PeriShah, Ashish, MD  Primary Gastroenterologist:  Roetta SessionsMichael Rourk, MD   Chief Complaint  Patient presents with  . IDA    HPI:  Shirley Perry is a 42 y.o. female here for follow-up of iron deficiency anemia.  She was seen back in September for GERD, constipation and was noted she was quite pale.  He had not seen her for quite some time and in the interim she had a hemoglobin in March 2018 revealing severe microcytic anemia with hemoglobin of 8.4, MCV 67.  She contributed that to significant menstrual loss.  Failed to follow-up with her gynecologist.  Last EGD/colonoscopy around 2011/2012.  Patient completed I FOBT which was negative.  Celiac serologies were negative as well. Her weight has been stable the last six months.   Patient presents without significant complaints. She feels tired all the time but accepts that as normal for her. She notes that she is vegetarian and iron sources come from beans. She feels pressure and fullness in the epigastric region. Overall bowel movements are regular on colace and Linzess. No melena, brbpr. No heartburn. No vomiting. Frequent nausea. Complains of excessive belching. C/o wheezing with coughing.    Current Outpatient Medications  Medication Sig Dispense Refill  . amitriptyline (ELAVIL) 75 MG tablet Take 75 mg by mouth 2 (two) times daily.     . diazepam (VALIUM) 10 MG tablet Take 10 mg by mouth 2 (two) times daily. Reported on 04/27/2015  2  . docusate sodium (COLACE) 100 MG capsule Take 100 mg by mouth daily.    Marland Kitchen. docusate sodium ([employer] STOOL SOFTENER) 250 MG capsule Take 250 mg by mouth daily.    Marland Kitchen. gabapentin (NEURONTIN) 300 MG capsule TAKE TWO CAPSULES BY MOUTH THREE TIMES DAILY 180 capsule 0  . LATUDA 20 MG TABS tablet Take 40 mg by mouth daily.   1  . levothyroxine (SYNTHROID, LEVOTHROID) 100 MCG tablet Take 1 tablet (100 mcg total) by mouth daily before breakfast. 90 tablet 3  . LINZESS 290 MCG CAPS capsule TAKE ONE CAPSULE BY  MOUTH DAILY BEFORE BREAKFAST - 09/19/2016 cs. needs pa DO not fill unless approved by insurance AT that time 30 capsule 5  . nystatin cream (MYCOSTATIN) APPLY TO AFFECTED AREA(S) TWICE DAILY 30 g 0  . omeprazole (PRILOSEC) 40 MG capsule TAKE 1 CAPSULE BY MOUTH TWICE DAILY 60 capsule 5  . oxybutynin (DITROPAN-XL) 10 MG 24 hr tablet TAKE 1 TABLET BY MOUTH TWICE DAILY. Takes once a day    . pentosan polysulfate (ELMIRON) 100 MG capsule Take 100 mg by mouth 3 (three) times daily before meals.      . promethazine (PHENERGAN) 25 MG suppository Place 1 suppository (25 mg total) rectally every 6 (six) hours as needed for nausea or vomiting. 30 each 0  . promethazine (PHENERGAN) 25 MG tablet TAKE 1 TABLET BY MOUTH EVERY SIX HOURS AS NEEDED FOR NAUSEA OR VOMITING 30 tablet 0  . sertraline (ZOLOFT) 50 MG tablet Take 1 tablet by mouth daily.  1  . triamcinolone (KENALOG) 0.025 % cream Apply 1 application topically 2 (two) times daily.  12  . VENTOLIN HFA 108 (90 Base) MCG/ACT inhaler INHALE TWO PUFFS EVERY 6 HOURS AS NEEDED WHEEZING SHORTNESS OF BREATH 18 g 2   No current facility-administered medications for this visit.     Allergies as of 05/23/2017  . (No Known Allergies)    Past Medical History:  Diagnosis Date  . Allergic rhinitis   .  Asthma   . Chronic constipation   . Depression   . GERD (gastroesophageal reflux disease)   . Hiatal hernia   . Hypothyroidism   . Interstitial cystitis   . Irritable bowel syndrome   . Obesity   . OSA on CPAP   . Overactive bladder   . Paranoia (HCC)   . Psoriasis     Past Surgical History:  Procedure Laterality Date  . BLADDER REPAIR    . CHOLECYSTECTOMY  1999  . COLONOSCOPY  2011   single anal papilla  . ESOPHAGOGASTRODUODENOSCOPY  2006   patulous EGJ, small hh  . ESOPHAGOGASTRODUODENOSCOPY  11/10/2010   ZOX:WRUEAV esophagus/patulous EG junction/small HH  . TUBAL LIGATION Bilateral     Family History  Problem Relation Age of Onset  . Ulcers  Mother   . Breast cancer Mother   . Diabetes Father   . Sleep apnea Sister   . Anesthesia problems Neg Hx   . Hypotension Neg Hx   . Malignant hyperthermia Neg Hx   . Pseudochol deficiency Neg Hx   . Colon cancer Neg Hx     Social History   Socioeconomic History  . Marital status: Divorced    Spouse name: Not on file  . Number of children: 2  . Years of education: College  . Highest education level: Not on file  Social Needs  . Financial resource strain: Not on file  . Food insecurity - worry: Not on file  . Food insecurity - inability: Not on file  . Transportation needs - medical: Not on file  . Transportation needs - non-medical: Not on file  Occupational History  . Occupation: unemployed    Associate Professor: UNEMPLOYED    Employer: NOT EMPLOYED  Tobacco Use  . Smoking status: Never Smoker  . Smokeless tobacco: Never Used  Substance and Sexual Activity  . Alcohol use: No  . Drug use: No  . Sexual activity: No  Other Topics Concern  . Not on file  Social History Narrative   Patient lives at home with her daughter.   Caffeine Use: some      ROS:  General: Negative for anorexia, weight loss, fever, chills, +fatigue, weakness. Eyes: Negative for vision changes.  ENT: Negative for hoarseness, difficulty swallowing , nasal congestion. CV: Negative for chest pain, angina, palpitations, dyspnea on exertion, peripheral edema.  Respiratory: Negative for dyspnea at rest, dyspnea on exertion, +cough, no sputum, +wheezing.  GI: See history of present illness. GU:  Negative for dysuria, hematuria, urinary incontinence, urinary frequency, nocturnal urination.  MS: Negative for joint pain, low back pain.  Derm: Negative for rash or itching.  Neuro: Negative for weakness, abnormal sensation, seizure, frequent headaches, memory loss, confusion.  Psych: + for anxiety, depression. No suicidal ideation, hallucinations.  Endo: Negative for unusual weight change.  Heme: Negative for  bruising or bleeding. Allergy: Negative for rash or hives.    Physical Examination:  BP (!) 148/85   Pulse 99   Temp (!) 96.8 F (36 C) (Oral)   Ht 5\' 5"  (1.651 m)   Wt 231 lb 3.2 oz (104.9 kg)   LMP 05/23/2017   BMI 38.47 kg/m    General: pale, female, in NAD.  Head: Normocephalic, atraumatic.   Eyes: Conjunctiva pink, no icterus. Mouth: Oropharyngeal mucosa moist and pink , no lesions erythema or exudate. Neck: Supple without thyromegaly, masses, or lymphadenopathy.  Lungs: Clear to auscultation bilaterally.  Heart: Regular rate and rhythm, no murmurs rubs or gallops.  Abdomen:  Bowel sounds are normal, full epigastrium/mass-like, tender with palpation in epigastric region, remainder of abd very soft.  no hepatosplenomegaly, no abdominal bruits or    hernia , no rebound or guarding.   Rectal: not performed Extremities: No lower extremity edema. No clubbing or deformities.  Neuro: Alert and oriented x 4 , grossly normal neurologically.  Skin: Warm and dry, no rash or jaundice.   Psych: Alert and cooperative, flat affect.   Labs:  02/2017, Hgb 8, MCV 65. Ferritin 4.   Imaging Studies: No results found.

## 2017-05-23 NOTE — Telephone Encounter (Signed)
Called [employer] and PA for CT ABD/PELVIS with contrast was approved. Auth: [id number] valid 05/23/17-06/21/17. Orders have been faxed to [facility] per pt request to have test done there

## 2017-05-23 NOTE — Assessment & Plan Note (Signed)
42 y/o female with profound IDA in setting of heavy menses, ifobt negative stool who presents for follow up. Chronically she has GERD, constipation. Complains of epigastric fullness/hardness, excessive belching, nausea but no vomiting. On exam she has large hard area in the epigastric region, ?liver vs mass. Recommend CT A/P with contrast initially. If unremarkable, she will require colonoscopy/egd to evaluate her symptoms and IDA. She will require deep sedation given polypharmacy.  I have discussed the risks, alternatives, benefits with regards to but not limited to the risk of reaction to medication, bleeding, infection, perforation and the patient is agreeable to proceed. Written consent to be obtained.

## 2017-05-24 NOTE — Progress Notes (Signed)
CC'ED TO PCP 

## 2017-06-04 NOTE — Telephone Encounter (Signed)
Forwarding to leslie as an [place]

## 2017-06-04 NOTE — Telephone Encounter (Signed)
I spoke with Melissa with centralized scheduling([phone] option 3) at [facility] and she stated they have tried several times to contact the patient and left messages, however the patient has not returned their call.

## 2017-06-11 NOTE — Telephone Encounter (Signed)
Please contact patient and ask her if she plans to have labs and CT scan done. Her authorization for CT expires next week.   We cannot help her if she doesn't follow through on her end.

## 2017-06-11 NOTE — Telephone Encounter (Signed)
Called pt and went straight to VM. LMOVM to call back

## 2017-06-12 ENCOUNTER — Encounter: Payer: Self-pay | Admitting: *Deleted

## 2017-06-12 NOTE — Telephone Encounter (Signed)
Patient called back and she reports she has had a lot going on. She is aware her CT approval expires next week and she needs to call [facility] to get this scheduled. She stated she is not sure yet if she is going to have the lab work done. I advised her that she needs to have this done in order for us to help her. She stated "okay". Forwarding to LSL as an [place]

## 2017-06-12 NOTE — Telephone Encounter (Signed)
Wait to see if patient follows through. Unfortunately her noncompliance is making it difficult to help her.

## 2017-06-12 NOTE — Telephone Encounter (Signed)
LMOVM

## 2017-06-14 NOTE — Telephone Encounter (Signed)
Called [facility] and pt Ct scheduled for 06/20/17 at 1:30pm.

## 2017-06-20 ENCOUNTER — Telehealth: Payer: Self-pay | Admitting: Internal Medicine

## 2017-06-20 NOTE — Telephone Encounter (Signed)
Noted and will forward to GerberLeslie as an [place]

## 2017-06-20 NOTE — Telephone Encounter (Signed)
PATIENT WAS TOLD TO CALL AND LET US KNOW WHEN SHE HAD HER [facility] SCAN DONE [facility],  SHE HAD THAT TODAY

## 2017-06-28 ENCOUNTER — Telehealth: Payer: Self-pay | Admitting: Gastroenterology

## 2017-06-28 NOTE — Telephone Encounter (Signed)
Please let patient know her CT scan showed moderate stool throughout the colon. 2.5cm uterine mass/fibroid.   1. She needs to follow up with gyn for heavy menses and uterine mass on CT. Probably fibroid but defer to gyn.  2. She needs TCS +/- EGD with propofol for severe IDA.  3. Make sure she is taking linzess daily several days prior to her bowel prep.  4. I also need her to have her labs done that were requested 04/2017.

## 2017-07-01 ENCOUNTER — Telehealth: Payer: Self-pay | Admitting: Internal Medicine

## 2017-07-01 ENCOUNTER — Other Ambulatory Visit: Payer: Self-pay | Admitting: Gastroenterology

## 2017-07-01 NOTE — Telephone Encounter (Signed)
Routing message 

## 2017-07-01 NOTE — Telephone Encounter (Signed)
Pt notified of results. Pt says she wants to think about having the procedures done and being seen by a GYN Dr. Rock NephewPt was asked to call back when ready to schedule these procedures.

## 2017-07-01 NOTE — Telephone Encounter (Signed)
Lmom, waiting on a return call.  

## 2017-07-01 NOTE — Telephone Encounter (Signed)
Noted  

## 2017-07-01 NOTE — Telephone Encounter (Signed)
Pt called asking for a prescription of phenergan suppositories. She uses [facility]

## 2017-07-02 NOTE — Telephone Encounter (Signed)
Noted  

## 2017-07-04 NOTE — Telephone Encounter (Signed)
Patient still haven't received her Phenergan suppositories

## 2017-07-04 NOTE — Telephone Encounter (Signed)
Noted  

## 2017-07-04 NOTE — Telephone Encounter (Signed)
RX done. 

## 2017-07-08 ENCOUNTER — Telehealth: Payer: Self-pay

## 2017-07-08 NOTE — Telephone Encounter (Signed)
Received a PA through [email or web address] for Phenergan Suppositories. After submitting PA, [facility] [employer] [facility]. They wanted a dx for pt. PA is being reviewed. [employer] will contact our office soon with an approval or denial.

## 2017-07-08 NOTE — Telephone Encounter (Signed)
Noted PA has been approved.

## 2017-07-08 NOTE — Telephone Encounter (Signed)
Steward DroneBrenda called from [employer] and the phenergan suppository was approved starting today x 1 year.

## 2017-07-24 ENCOUNTER — Other Ambulatory Visit: Payer: Self-pay | Admitting: Nurse Practitioner

## 2017-08-05 ENCOUNTER — Other Ambulatory Visit: Payer: Self-pay | Admitting: Nurse Practitioner

## 2017-08-05 ENCOUNTER — Other Ambulatory Visit: Payer: Self-pay | Admitting: Pediatrics

## 2017-08-07 ENCOUNTER — Other Ambulatory Visit: Payer: Self-pay | Admitting: Pediatrics

## 2017-09-04 ENCOUNTER — Other Ambulatory Visit: Payer: Self-pay | Admitting: Gastroenterology

## 2017-09-09 ENCOUNTER — Telehealth: Payer: Self-pay | Admitting: Internal Medicine

## 2017-09-09 DIAGNOSIS — R112 Nausea with vomiting, unspecified: Secondary | ICD-10-CM

## 2017-09-09 NOTE — Telephone Encounter (Signed)
Pt would like a refill of her Phenergan tablet sent into [facility].

## 2017-09-09 NOTE — Telephone Encounter (Signed)
Pt called asking to speak with the nurse. I told her that the nurse was with patients and I could take a message. She is needing a refill on her Phenergan at [facility]. I told her that she could call [facility] and have them send [place] a refill request. She said the pharmacy told her to call us. [phone]

## 2017-09-11 ENCOUNTER — Telehealth: Payer: Self-pay | Admitting: Internal Medicine

## 2017-09-11 NOTE — Telephone Encounter (Signed)
Second request for Phenergan. Pt would like it sent to her pharmacy.

## 2017-09-11 NOTE — Telephone Encounter (Signed)
[phone] patient needs her promethazine, sent in to her pharmacy

## 2017-09-12 MED ORDER — PROMETHAZINE HCL 25 MG PO TABS: ORAL_TABLET | ORAL | 2 refills | Status: DC

## 2017-09-12 NOTE — Addendum Note (Signed)
Addended by: Delane Ginger, Shahara Hartsfield A on: 09/12/2017 03:31 PM   Modules accepted: Orders

## 2017-09-12 NOTE — Telephone Encounter (Signed)
Rx sent to pharmacy.  Please notify patient.

## 2017-09-12 NOTE — Telephone Encounter (Signed)
See previous note

## 2017-09-12 NOTE — Telephone Encounter (Signed)
Pt.notified

## 2017-10-03 ENCOUNTER — Telehealth: Payer: Self-pay

## 2017-10-03 NOTE — Telephone Encounter (Signed)
PA request received from [email or web address]. PA was submitted to pts insurance company. Waiting on an approval or denial.

## 2017-10-04 NOTE — Telephone Encounter (Signed)
PA approval for Linzess x 1 year. LMOM, pt notified of approval of medication x 1 year.

## 2017-10-31 ENCOUNTER — Other Ambulatory Visit: Payer: Self-pay | Admitting: Gastroenterology

## 2017-11-01 NOTE — Telephone Encounter (Signed)
Do they need suppositories or oral?

## 2017-11-05 NOTE — Telephone Encounter (Signed)
Lmom, waiting on a return call.  

## 2017-11-07 MED ORDER — PROMETHAZINE HCL 25 MG RE SUPP: RECTAL | 0 refills | Status: DC

## 2017-11-07 NOTE — Addendum Note (Signed)
Addended by: Gelene MinkBOONE, [place] W on: 11/07/2017 11:34 AM   Modules accepted: Orders

## 2017-11-07 NOTE — Telephone Encounter (Signed)
PT does need the suppositories.

## 2017-11-25 ENCOUNTER — Other Ambulatory Visit: Payer: Self-pay | Admitting: Nurse Practitioner

## 2017-11-25 ENCOUNTER — Other Ambulatory Visit: Payer: Self-pay | Admitting: Gastroenterology

## 2017-11-25 DIAGNOSIS — R112 Nausea with vomiting, unspecified: Secondary | ICD-10-CM

## 2018-01-02 ENCOUNTER — Other Ambulatory Visit: Payer: Self-pay | Admitting: Gastroenterology

## 2018-01-22 ENCOUNTER — Other Ambulatory Visit: Payer: Self-pay | Admitting: Gastroenterology

## 2018-01-31 ENCOUNTER — Other Ambulatory Visit: Payer: Self-pay | Admitting: Gastroenterology

## 2018-01-31 DIAGNOSIS — R112 Nausea with vomiting, unspecified: Secondary | ICD-10-CM

## 2018-03-03 ENCOUNTER — Other Ambulatory Visit: Payer: Self-pay | Admitting: Gastroenterology

## 2018-03-03 DIAGNOSIS — R112 Nausea with vomiting, unspecified: Secondary | ICD-10-CM

## 2018-03-03 NOTE — Telephone Encounter (Signed)
Phenergan was just sent in on 11/6. Did she not receive?

## 2018-03-03 NOTE — Telephone Encounter (Signed)
AB,  she did receive it.

## 2018-03-04 ENCOUNTER — Other Ambulatory Visit: Payer: Self-pay | Admitting: Gastroenterology

## 2018-03-04 DIAGNOSIS — R112 Nausea with vomiting, unspecified: Secondary | ICD-10-CM

## 2018-03-04 NOTE — Telephone Encounter (Signed)
Noted. She needs an office visit. Has not been seen in awhile. Going through Phenergan quickly. I have refilled.

## 2018-03-13 NOTE — Telephone Encounter (Signed)
Noted  

## 2018-03-25 ENCOUNTER — Telehealth: Payer: Self-pay

## 2018-03-25 NOTE — Telephone Encounter (Signed)
Pt returned call and is asking for a 10 day emergency supply of Omeprazole and Linzess in the event of a tornado or storm. When asked if she could place the pills that she already gets on a monthly basis in her bag if she has to leave her home, pt said her insurance company recommended an emergency supply. Please advise.

## 2018-03-25 NOTE — Telephone Encounter (Signed)
Pt left a VM asking for an Emergency supply of Omeprazole and Liness. LMOM, waiting on a return call from pt.

## 2018-03-25 NOTE — Telephone Encounter (Signed)
Nearly one year since patient seen. She has not had requested labs and has declined work of of TCS +/-EGD.  Recommend OV.   Emergency supply of omeprazole and Linzess not necessary.

## 2018-03-26 NOTE — Telephone Encounter (Signed)
Noted. Pt didn't schedule an appointment and is aware that an emergency supply of medication isn't needed.

## 2018-07-02 ENCOUNTER — Other Ambulatory Visit: Payer: Self-pay | Admitting: Gastroenterology

## 2018-07-16 ENCOUNTER — Other Ambulatory Visit: Payer: Self-pay

## 2018-07-16 MED ORDER — OMEPRAZOLE 40 MG PO CPDR: 40.0000 mg | DELAYED_RELEASE_CAPSULE | Freq: Two times a day (BID) | ORAL | 5 refills | Status: DC

## 2018-07-16 NOTE — Telephone Encounter (Signed)
Pt notified that she will need an appointment prior to getting additional refills.

## 2018-07-16 NOTE — Telephone Encounter (Signed)
Limited RX given as patient declined making OV for follow up as suggested. Needs OV by 11/2018.

## 2018-08-11 ENCOUNTER — Other Ambulatory Visit: Payer: Self-pay | Admitting: Gastroenterology

## 2018-08-19 ENCOUNTER — Other Ambulatory Visit: Payer: Self-pay | Admitting: Gastroenterology

## 2018-08-20 ENCOUNTER — Telehealth: Payer: Self-pay

## 2018-08-20 NOTE — Telephone Encounter (Signed)
PA for Phenergan suppositories submitted through [email or web address]. waiting on approval or denial.

## 2018-08-25 ENCOUNTER — Telehealth: Payer: Self-pay | Admitting: Internal Medicine

## 2018-08-25 NOTE — Telephone Encounter (Signed)
Pt called asking for [place] to appeal the prescription of her promethizine suppository. Please advise. [phone]

## 2018-08-28 NOTE — Telephone Encounter (Signed)
Patient last seen 04/2017. Needs OV (virtual).

## 2018-08-28 NOTE — Telephone Encounter (Signed)
LSL, pt's Phenergan suppositories were denied by pts insurance. She would like [place] to make an appeal. I called her insurance and the number is fax [phone]. Do you want to submit an appeal? Please advise.

## 2018-09-01 NOTE — Telephone Encounter (Signed)
Noted. Spoke with pt, scheduled telephone visit. Pt isn't able to do a virtual visit due to her phone not being up to date.

## 2018-09-09 ENCOUNTER — Encounter: Payer: Self-pay | Admitting: Gastroenterology

## 2018-09-09 ENCOUNTER — Other Ambulatory Visit: Payer: Self-pay

## 2018-09-09 ENCOUNTER — Ambulatory Visit (INDEPENDENT_AMBULATORY_CARE_PROVIDER_SITE_OTHER): Payer: Medicare Other | Admitting: Gastroenterology

## 2018-09-09 DIAGNOSIS — K5900 Constipation, unspecified: Secondary | ICD-10-CM

## 2018-09-09 DIAGNOSIS — R1110 Vomiting, unspecified: Secondary | ICD-10-CM | POA: Insufficient documentation

## 2018-09-09 DIAGNOSIS — R112 Nausea with vomiting, unspecified: Secondary | ICD-10-CM | POA: Diagnosis not present

## 2018-09-09 DIAGNOSIS — D500 Iron deficiency anemia secondary to blood loss (chronic): Secondary | ICD-10-CM

## 2018-09-09 DIAGNOSIS — K219 Gastro-esophageal reflux disease without esophagitis: Secondary | ICD-10-CM | POA: Diagnosis not present

## 2018-09-09 MED ORDER — PROMETHAZINE HCL 25 MG RE SUPP: RECTAL | 0 refills | Status: DC

## 2018-09-09 NOTE — Patient Instructions (Addendum)
1. We will review your most recent labs done by your PCP and be in touch with you. 2. I will work towards getting phenergan suppositories covered.  3. Continue Linzess and omeprazole as before.

## 2018-09-09 NOTE — Progress Notes (Signed)
Primary Care Physician:  Kirstie Peri, MD Primary GI:  Roetta Sessions, MD   Patient Location: Home  Provider Location: [facility] office  Reason for Phone Visit: nausea, wants appeal for phenergan suppositories  Persons present on the phone encounter, with roles: Patient, myself (provider),mindy silva cma(updated meds and allergies)  Total time (minutes) spent on medical discussion: 15 minutes  Due to COVID-19, visit was conducted using telephonic method (no video was available).  Visit was requested by patient.  Virtual Visit via Telephone only  I connected with Ms. Rhew on 09/09/18 at  2:00 PM EDT by telephone and verified that I am speaking with the correct person using two identifiers.   I discussed the limitations, risks, security and privacy concerns of performing an evaluation and management service by telephone and the availability of in person appointments. I also discussed with the patient that there may be a patient responsible charge related to this service. The patient expressed understanding and agreed to proceed.  Chief Complaint  Patient presents with  . Anemia    f/u.  Marland Kitchen Constipation    not an issue  . Nausea    w/ vomiting. Nauseated daily basis, vomiting 1-3 times per week    HPI:   Patient is a pleasant 43 y/o female  who presents for telephone visit regarding wanting [place] to write an appeal to get her phenergan suppositories covered.  We last saw her in January 2019 therefore asked her to have a virtual visit.  She has a history of IDA, GERD, constipation.  She contributed her IDA to significant menstrual loss.  Encouraged her to follow-up with her gynecologist.  Her last EGD and colonoscopy was around 2011/2012.  Celiac serologies previously negative.  At the time she is a vegetarian, iron sources coming from beans.  I FOBT was negative.  Has also had a lot of issues in the past with epigastric fullness, belching, nausea.  When I last saw her on exam she had a  fullness in the epigastrium and CT was recommended.  Outside of constipation and uterine fibroid there were no other abnormalities found.  We recommended updating EGD and colonoscopy due to IDA but she declined.  PCP referred her to hematology in March but she canceled that appointment.  Patient reports ongoing nausea, tends to be worse in the morning.  She vomits at least 1 to 3 days/week.  Usually will vomit 1-2 times per day.  She is able to function.  She prefers Phenergan suppositories on these days when she is actually vomiting.  She tries not to take Phenergan unless she has to.  She denies melena, rectal bleeding.  She reports her bowel movements are regular.  She takes Linzess 290 mcg daily.  She does have some heartburn.  He continues to have heavy menses, bleeds 5 to 7 days/month, heavy for several days associated with clots.  She tells me she no longer has a gynecologist.  She complains of associated fatigue.  At times does have some dyspnea on exertion.  No lightheadedness.  She is on Mobic and aspirin.  She takes omeprazole 40 mg twice daily. She denies weight loss.  Remote gastric emptying study in 2012 was normal.   Current Outpatient Medications  Medication Sig Dispense Refill  . acetaminophen (TYLENOL) 500 MG tablet Take 1,000 mg by mouth every 6 (six) hours as needed.    Marland Kitchen amitriptyline (ELAVIL) 75 MG tablet Take 75 mg by mouth daily.     Marland Kitchen  aspirin EC 81 MG tablet Take 81 mg by mouth daily.    . baclofen (LIORESAL) 10 MG tablet Take 10 mg by mouth 2 (two) times a day. Can take up to 3 times a day if needed    . bisacodyl (DULCOLAX) 5 MG EC tablet Take 5 mg by mouth daily as needed for moderate constipation.    . diazepam (VALIUM) 5 MG tablet Take 5 mg by mouth 2 (two) times daily. And can take extra if needed  2  . docusate sodium (COLACE) 100 MG capsule Take 100 mg by mouth every other day.     . docusate sodium ([employer] STOOL SOFTENER) 250 MG capsule Take 250 mg by mouth daily.    .  DULoxetine (CYMBALTA) 30 MG capsule Take 30 mg by mouth daily.    . Fiber POWD Take by mouth. 20 ml's in the am, 10 ml's evening, 20 ml's at bedtime    . gabapentin (NEURONTIN) 300 MG capsule TAKE TWO CAPSULES BY MOUTH THREE TIMES DAILY (Patient taking differently: Take 900 mg by mouth 4 (four) times daily. ) 180 capsule 0  . LATUDA 80 MG TABS tablet Take 80 mg by mouth daily.   1  . levothyroxine (SYNTHROID, LEVOTHROID) 100 MCG tablet Take 1 tablet (100 mcg total) by mouth daily before breakfast. 90 tablet 3  . LINZESS 290 MCG CAPS capsule TAKE ONE CAPSULE BY MOUTH DAILY BEFORE BREAKFAST 90 capsule 5  . meloxicam (MOBIC) 15 MG tablet Take 15 mg by mouth daily.    . methylPREDNISolone (MEDROL DOSEPAK) 4 MG TBPK tablet Once a month as needed    . nystatin cream (MYCOSTATIN) APPLY TO AFFECTED AREA(S) TWICE DAILY (Patient taking differently: 2 (two) times daily as needed. ) 30 g 0  . omeprazole (PRILOSEC) 40 MG capsule Take 1 capsule (40 mg total) by mouth 2 (two) times daily. 60 capsule 5  . oxybutynin (DITROPAN-XL) 10 MG 24 hr tablet Take 10 mg by mouth daily.     . pentosan polysulfate (ELMIRON) 100 MG capsule Take 100 mg by mouth 3 (three) times daily before meals.      . peppermint oil liquid by Does not apply route every other day. 50 ml's    . promethazine (PHENERGAN) 25 MG suppository INSERT ONE SUPPOSITORY RECTALLY EVERY 6 HOURS AS NEEDED FOR NAUSEA AND VOMITING 30 suppository 0  . promethazine (PHENERGAN) 25 MG tablet TAKE 1 TABLET BY MOUTH EVERY 6 HOURS AS NEEDED FOR NAUSEA AND VOMITING 30 tablet 0  . traMADol (ULTRAM) 50 MG tablet Take 50-100 mg by mouth every 8 (eight) hours as needed.    . triamcinolone (KENALOG) 0.025 % cream Apply 1 application topically 2 (two) times daily as needed.   12  . VENTOLIN HFA 108 (90 Base) MCG/ACT inhaler INHALE TWO PUFFS EVERY 6 HOURS AS NEEDED WHEEZING SHORTNESS OF BREATH 18 g 2   No current facility-administered medications for this visit.        ROS:  General: Negative for anorexia, weight loss, fever, chills, weakness.  See HPI Eyes: Negative for vision changes.  ENT: Negative for hoarseness, difficulty swallowing , nasal congestion. CV: Negative for chest pain, angina, palpitations, dyspnea on exertion, peripheral edema.  Respiratory: Negative for dyspnea at rest, dyspnea on exertion, cough, sputum, wheezing.  GI: See history of present illness. GU:  Negative for dysuria, hematuria, urinary incontinence, urinary frequency, nocturnal urination.  MS: Negative for joint pain, low back pain.  Derm: Negative for rash or itching.  Neuro: Negative for weakness, abnormal sensation, seizure, frequent headaches, memory loss, confusion.  Psych: Negative for anxiety, depression, suicidal ideation, hallucinations.  Endo: Negative for unusual weight change.  Heme: Negative for bruising or bleeding. Allergy: Negative for rash or hives.   Observations/Objective: Pleasant female no acute distress.  Vague in her history but this is her baseline.  Flat affect again her baseline.  Otherwise exam not available.  Assessment and Plan: Pleasant 43 year old female with history of chronic nausea with intermittent vomiting, GERD, constipation, IDA.  Chronic nausea, uses Phenergan as needed but not daily.  On day she has vomiting she prefers suppository form but has had issues getting this obtained through her insurance.  We will work towards approval, appeal letter if needed.  Typical GERD symptoms well controlled on current regimen.  Chronic constipation well controlled on Linzess.  IDA in the setting of heavy menses/fibroids.  Unfortunately patient did not follow through with her hematology consultation which was arranged by her PCP back in March.  Previously she has declined EGD and colonoscopy for evaluation of IDA and her chronic nausea.  She has had remote EGD/colonoscopy as outlined above.  For now we will obtain copy of recent labs done by her  PCP.  Further recommendations to follow.  Would anticipate offering colonoscopy/EGD for IDA and chronic nausea.  Follow Up Instructions:    I discussed the assessment and treatment plan with the patient. The patient was provided an opportunity to ask questions and all were answered. The patient agreed with the plan and demonstrated an understanding of the instructions. AVS mailed to patient's home address.   The patient was advised to call back or seek an in-person evaluation if the symptoms worsen or if the condition fails to improve as anticipated.  I provided 15 minutes of non-face-to-face time during this encounter.   Tana CoastLeslie , PA-C

## 2018-09-10 ENCOUNTER — Encounter: Payer: Self-pay | Admitting: Gastroenterology

## 2018-09-10 ENCOUNTER — Telehealth: Payer: Self-pay | Admitting: Gastroenterology

## 2018-09-10 NOTE — Telephone Encounter (Signed)
I completed a PA and it was denied. When [facility] sent [place] a denial letter, Pt received one as well. Pt then called and asked for our office to appeal it. I can submit another PA.

## 2018-09-10 NOTE — Telephone Encounter (Signed)
Shirley Perry, patient seen via telephone visit yesterday.  Previously she requested appeal letter be sent for Phenergan suppositories.  I have sent a new prescription to her pharmacy to see if we received prior authorization etc.  It is not clear where the issues why, she reports getting a letter from [facility] but it appears she has Medicaid as a secondary??  If ongoing denial, let me know and I will do an appeal letter.

## 2018-09-10 NOTE — Telephone Encounter (Signed)
PA submitted.

## 2018-09-11 ENCOUNTER — Telehealth: Payer: Self-pay | Admitting: Gastroenterology

## 2018-09-11 NOTE — Telephone Encounter (Signed)
FYI to Dr. Rourk.  

## 2018-09-11 NOTE — Telephone Encounter (Signed)
Reviewed records from 06/2018 from PCP.  H/H 5.6/21.1, MCV 61  Discussed with patient regarding dangerously low H/H three months ago. Need to update labs. She has had DOE, fatigue which is likely related to extremely low Hgb. Previously she no showed hematology appt 06/2018 arranged by PCP.   She was urged to go to ED if progressive symptoms.  She is refusing updating labs. She voiced appreciation for my concern but is refusing labs, blood transfusion, work up of her IDA.  She continues to have heavy menses and has not seen gynecology in several years.    She understands that significant anemia like this can be life-threatening.   She was encouraged to call if she changes her mind.

## 2018-09-16 NOTE — Progress Notes (Signed)
CC'ED TO PCP 

## 2018-09-17 NOTE — Telephone Encounter (Signed)
Opened in error

## 2018-09-17 NOTE — Telephone Encounter (Signed)
Communication noted.  

## 2018-10-16 ENCOUNTER — Telehealth: Payer: Self-pay

## 2018-10-16 NOTE — Telephone Encounter (Signed)
PA submitted on covermymeds.com for Linzess 290 mcg and Phenergan suppositories. Linzess 290 mcg was approved. Approval letter will be scanned in pts chart. Pt was notified of approval.

## 2018-10-21 NOTE — Telephone Encounter (Signed)
PA for Phenergan suppositories was denied due to pt not having medication while local anestesia.

## 2018-10-27 ENCOUNTER — Telehealth: Payer: Self-pay | Admitting: Internal Medicine

## 2018-10-27 NOTE — Telephone Encounter (Signed)
Routing to LSL 

## 2018-10-27 NOTE — Telephone Encounter (Signed)
LSL, pt is wanting another appeal done for Phenergan suppositories. A PA and appeal  for Phenergan suppositories were denied due to pt not having medication while local anesthesia last week. Pt can use a Good Rx coupon but the medication is still too expensive for her (20 suppositories for $93.00, (12) suppositories $48-56, 6 suppositories $28-33 with GoodRx coupon.

## 2018-10-27 NOTE — Telephone Encounter (Signed)
[phone]  PLEASE CALL PATIENT, SHE IS ASKING [place] IF WE CAN DO AN APPEAL ON A MEDICATION THAT HER INSURANCE DENIED.

## 2018-10-31 NOTE — Telephone Encounter (Signed)
Discussed with AM. Phenergan suppositories have been denied even with appeal. Apparently only approved if patient is receiving anesthesia for a procedure.   No further appeals to be done. Please let patient know.

## 2018-11-03 NOTE — Telephone Encounter (Signed)
Received a call from [facility], pt has filed for an appeal. They will contact pt if it's approved.

## 2018-11-12 ENCOUNTER — Ambulatory Visit: Payer: Self-pay | Admitting: Gastroenterology

## 2019-02-04 ENCOUNTER — Telehealth: Payer: Self-pay | Admitting: Internal Medicine

## 2019-02-04 DIAGNOSIS — K625 Hemorrhage of anus and rectum: Secondary | ICD-10-CM

## 2019-02-04 MED ORDER — HYDROCORTISONE (PERIANAL) 2.5 % EX CREA: 1.0000 "application " | TOPICAL_CREAM | Freq: Two times a day (BID) | CUTANEOUS | 1 refills | Status: DC

## 2019-02-04 NOTE — Addendum Note (Signed)
Addended by: Mahala Menghini on: 02/04/2019 12:13 PM   Modules accepted: Orders

## 2019-02-04 NOTE — Telephone Encounter (Signed)
Patient notified that an ointment was sent to [facility]

## 2019-02-04 NOTE — Telephone Encounter (Signed)
Patient wanted to know if she can be prescribed a hemorrhoid ointment-[phone]-uses [facility]

## 2019-02-04 NOTE — Telephone Encounter (Signed)
rx sent

## 2019-02-04 NOTE — Telephone Encounter (Signed)
Patient notified the rx was sent

## 2019-02-04 NOTE — Telephone Encounter (Signed)
Pt would like a hemorrhoid prescription ointment sent into her pharmacy. Pt called with c/o itching, burning, some tenderness with hemorrhoids. Pt says she gets hemorrhoids on the outside and inside of rectum.

## 2019-02-05 NOTE — Telephone Encounter (Signed)
Pt called back this morning. Pt is requesting a hemorrhoid ointment over the hemorrhoid cream that was sent to her pharmacy. Anusol 2.5% cr was sent in to pts pharmacy. Sending to EG in the absence of LSL.

## 2019-02-09 NOTE — Telephone Encounter (Signed)
Returning a call.  [phone]

## 2019-02-09 NOTE — Telephone Encounter (Signed)
LMOM to call.

## 2019-02-09 NOTE — Telephone Encounter (Signed)
I am not aware of any hemorrhoid ointment other than OTC Preparation H ointment (they have a petroleum based ointment available). I will keep looking and if I find anything else we will let him know.  Another possible option is a suppository if he would like to try that.

## 2019-02-09 NOTE — Telephone Encounter (Addendum)
PT is aware what Randall Hiss said.  She said her pharmacist mentioned a hydrocortisone suppository. Randall Hiss, please advise!

## 2019-02-10 ENCOUNTER — Encounter: Payer: Self-pay | Admitting: Internal Medicine

## 2019-02-11 MED ORDER — HYDROCORTISONE ACETATE 25 MG RE SUPP: 25.0000 mg | Freq: Two times a day (BID) | RECTAL | 1 refills | Status: DC | PRN

## 2019-02-11 NOTE — Telephone Encounter (Signed)
LMOM to call.

## 2019-02-11 NOTE — Telephone Encounter (Signed)
Yes, a suppository we can do. Just keep in mind it may be more expensive than the cream.  Rx sent.

## 2019-02-11 NOTE — Addendum Note (Signed)
Addended by: Gordy Levan, Jarelly Rinck A on: 02/11/2019 02:58 PM   Modules accepted: Orders

## 2019-02-13 NOTE — Telephone Encounter (Signed)
Left vm that Rx has been sent in

## 2019-03-17 ENCOUNTER — Ambulatory Visit: Payer: Medicare Other | Admitting: Internal Medicine

## 2019-03-24 ENCOUNTER — Telehealth: Payer: Self-pay | Admitting: Internal Medicine

## 2019-03-24 NOTE — Telephone Encounter (Signed)
[phone] EXT [place] [facility] OFFICE, THEY ARE TRYING TO GET HER APPROVED FOR A MEDICATION AND NEED TO KNOW IF WE HAVE NOTES STATING SHE HAS TRIED ALTERNATIVES

## 2019-03-24 NOTE — Telephone Encounter (Signed)
lmom for Raquel Sarna. Waiting on a return call.

## 2019-03-24 NOTE — Telephone Encounter (Signed)
Shirley Perry returned call. Shirley Perry wanted to inquire if pt has taken Amitiza previously so a PA can be submitted by their office. Shirley Perry is aware that pt tried Amitiza 11/2010-12/2010.

## 2019-03-25 ENCOUNTER — Telehealth: Payer: Self-pay | Admitting: Internal Medicine

## 2019-03-25 NOTE — Telephone Encounter (Signed)
Spoke with pt. Pt is requesting Anucort Suppository be sent to her pharmacy since the other suppositories weren't covered under her insurance. Anusol HC suppository was previously sent to pts pharmacy.

## 2019-03-25 NOTE — Telephone Encounter (Signed)
PATIENT CALLED AND SAID THAT HER INSURANCE WILL NOT COVER THE SUPPOSITORIES, PLEASE CALL [phone]  WANTS TO KNOW IF SHE CAN CALL IN ANUCORT HC 25MG  AS A SUBSTITUTION

## 2019-03-26 MED ORDER — HYDROCORTISONE ACETATE 25 MG RE SUPP: 25.0000 mg | Freq: Two times a day (BID) | RECTAL | 1 refills | Status: DC

## 2019-03-26 NOTE — Telephone Encounter (Signed)
I doubt Anucort supp will be covered if generic was not covered. She didn't want the anusol cream previously prescribed.   I have sent in Hugo per her request. Advise her to keep tomorrow's appt.

## 2019-03-26 NOTE — Addendum Note (Signed)
Addended by: Mahala Menghini on: 03/26/2019 01:29 PM   Modules accepted: Orders

## 2019-03-26 NOTE — Telephone Encounter (Signed)
Noted. Pt is aware that medication was sent in and pt will keep 03/31/2019 apt.

## 2019-03-31 ENCOUNTER — Telehealth: Payer: Self-pay

## 2019-03-31 ENCOUNTER — Other Ambulatory Visit: Payer: Self-pay

## 2019-03-31 ENCOUNTER — Ambulatory Visit (INDEPENDENT_AMBULATORY_CARE_PROVIDER_SITE_OTHER): Payer: Medicare Other | Admitting: Internal Medicine

## 2019-03-31 ENCOUNTER — Encounter: Payer: Self-pay | Admitting: Internal Medicine

## 2019-03-31 VITALS — BP 130/75 | HR 84 | Temp 97.8°F | Ht 65.0 in | Wt 243.6 lb

## 2019-03-31 DIAGNOSIS — K5903 Drug induced constipation: Secondary | ICD-10-CM

## 2019-03-31 MED ORDER — OMEPRAZOLE 40 MG PO CPDR: 40.0000 mg | DELAYED_RELEASE_CAPSULE | Freq: Two times a day (BID) | ORAL | 11 refills | Status: DC

## 2019-03-31 MED ORDER — LINACLOTIDE 290 MCG PO CAPS: 290.0000 ug | ORAL_CAPSULE | Freq: Every day | ORAL | 11 refills | Status: DC

## 2019-03-31 MED ORDER — PROMETHAZINE HCL 25 MG RE SUPP: RECTAL | 3 refills | Status: DC

## 2019-03-31 NOTE — Telephone Encounter (Signed)
Sent in prescriptions

## 2019-03-31 NOTE — Progress Notes (Signed)
Primary Care Physician:  Kirstie PeriShah, Ashish, MD Primary Gastroenterologist:  Dr. Jena Gaussourk  Pre-Procedure History & Physical: HPI:  Shirley Perry is a 43 y.o. female here for here for follow-up nausea constipation/GERD.  Continues on omeprazole 40 mg twice daily.  Linzess 290 daily produces a bowel movement about every 3 days.  Pain management physician just added Symphoric 2 mg daily to her regimen.  She starts tomorrow.  She is not had any rectal bleeding.  Colonoscopy negative about 10 years ago.  Profound iron deficiency anemia felt to be secondary to heavy menses.  We previously offered a colonoscopy and possible EGD she declined.  We discussed that avenue of evaluation today.  She continues to decline any endoscopic evaluation. Chronic nausea felt to be in part related to constipation.  Prior GES normal.  Uses Phenergan suppositories for nausea.  She rarely vomits.  Really denies any abdominal pain.  Past Medical History:  Diagnosis Date  . Allergic rhinitis   . Asthma   . Chronic constipation   . Depression   . GERD (gastroesophageal reflux disease)   . Hiatal hernia   . Hypothyroidism   . Interstitial cystitis   . Irritable bowel syndrome   . Obesity   . OSA on CPAP   . Overactive bladder   . Paranoia (HCC)   . Psoriasis     Past Surgical History:  Procedure Laterality Date  . BLADDER REPAIR    . CHOLECYSTECTOMY  1999  . COLONOSCOPY  2011   single anal papilla  . ESOPHAGOGASTRODUODENOSCOPY  2006   patulous EGJ, small hh  . ESOPHAGOGASTRODUODENOSCOPY  11/10/2010   RUE:[id number]:normal esophagus/patulous EG junction/small HH  . TUBAL LIGATION Bilateral     Prior to Admission medications   Medication Sig Start Date End Date Taking? Authorizing Provider  acetaminophen (TYLENOL) 500 MG tablet Take 1,000 mg by mouth every 6 (six) hours as needed.   Yes [provider]  amitriptyline (ELAVIL) 75 MG tablet Take 75 mg by mouth daily.    Yes [provider]  aspirin  EC 81 MG tablet Take 81 mg by mouth daily.   Yes [provider]  baclofen (LIORESAL) 10 MG tablet Take 10 mg by mouth 2 (two) times a day. Can take up to 3 times a day if needed   Yes [provider]  bisacodyl (DULCOLAX) 5 MG EC tablet 3 tablets every 3 days once a day   Yes [provider]  diazepam (VALIUM) 5 MG tablet Take 5 mg by mouth 2 (two) times daily. And can take extra if needed 12/23/14  Yes [provider]  docusate sodium (COLACE) 100 MG capsule 100mg  once a day and other other day 100mg  BID   Yes [provider]  docusate sodium ([employer]S STOOL SOFTENER) 250 MG capsule Take 250 mg by mouth daily.   Yes [provider]  DULoxetine (CYMBALTA) 30 MG capsule Take 30 mg by mouth daily.   Yes [provider]  Fiber POWD Take by mouth. 1610ml's 5 times a day   Yes [provider]  gabapentin (NEURONTIN) 300 MG capsule TAKE TWO CAPSULES BY MOUTH THREE TIMES DAILY Patient taking differently: Take 900 mg by mouth 4 (four) times daily.  11/06/16  Yes Johna SheriffVincent, Carol L, MD  LATUDA 80 MG TABS tablet Take 80 mg by mouth daily.  02/21/15  Yes [provider]  levothyroxine (SYNTHROID, LEVOTHROID) 100 MCG tablet Take 1 tablet (100 mcg total) by mouth daily  before breakfast. Patient taking differently: Take 88 mcg by mouth daily before breakfast.  06/25/16  Yes Eustaquio Maize, MD  LINZESS 290 MCG CAPS capsule TAKE ONE CAPSULE BY MOUTH DAILY BEFORE BREAKFAST 08/14/18  Yes Carlis Stable, NP  meloxicam (MOBIC) 15 MG tablet Take 15 mg by mouth daily.   Yes [provider]  methylPREDNISolone (MEDROL DOSEPAK) 4 MG TBPK tablet Once a month as needed 08/26/18  Yes [provider]  nystatin cream (MYCOSTATIN) APPLY TO AFFECTED AREA(S) TWICE DAILY Patient taking differently: 2 (two) times daily as needed.  01/15/17  Yes Eustaquio Maize, MD  omeprazole (PRILOSEC) 40 MG capsule Take 1 capsule (40 mg total) by mouth 2 (two)  times daily. 07/16/18  Yes Mahala Menghini, PA-C  oxybutynin (DITROPAN XL) 15 MG 24 hr tablet Take 15 mg by mouth daily.  04/10/16  Yes [provider]  pentosan polysulfate (ELMIRON) 100 MG capsule Take 100 mg by mouth 3 (three) times daily before meals.     Yes [provider]  promethazine (PHENERGAN) 25 MG suppository INSERT ONE SUPPOSITORY RECTALLY EVERY 6 HOURS AS NEEDED FOR NAUSEA AND VOMITING 09/09/18  Yes Mahala Menghini, PA-C  promethazine (PHENERGAN) 25 MG tablet Take 25 mg by mouth every 6 (six) hours as needed for nausea or vomiting.   Yes [provider]  traMADol (ULTRAM) 50 MG tablet Take 50-100 mg by mouth every 8 (eight) hours as needed.   Yes [provider]  triamcinolone (KENALOG) 0.025 % cream Apply 1 application topically 2 (two) times daily as needed.  05/08/17  Yes [provider]  VENTOLIN HFA 108 (90 Base) MCG/ACT inhaler INHALE TWO PUFFS EVERY 6 HOURS AS NEEDED WHEEZING SHORTNESS OF BREATH 11/28/16  Yes Eustaquio Maize, MD  hydrocortisone (ANUCORT-HC) 25 MG suppository Place 1 suppository (25 mg total) rectally 2 (two) times daily. For 10 days Patient not taking: Reported on 03/31/2019 03/26/19   Mahala Menghini, PA-C  hydrocortisone (ANUSOL-HC) 2.5 % rectal cream Place 1 application rectally 2 (two) times daily. For 10 days Patient not taking: Reported on 03/31/2019 02/04/19   Mahala Menghini, PA-C    Allergies as of 03/31/2019  . (No Known Allergies)    Family History  Problem Relation Age of Onset  . Ulcers Mother   . Breast cancer Mother   . Diabetes Father   . Sleep apnea Sister   . Anesthesia problems Neg Hx   . Hypotension Neg Hx   . Malignant hyperthermia Neg Hx   . Pseudochol deficiency Neg Hx   . Colon cancer Neg Hx     Social History   Socioeconomic History  . Marital status: Divorced    Spouse name: Not on file  . Number of children: 2  . Years of education: College  . Highest education level: Not on  file  Occupational History  . Occupation: unemployed    Fish farm manager: UNEMPLOYED    Employer: NOT EMPLOYED  Social Needs  . Financial resource strain: Not on file  . Food insecurity    Worry: Not on file    Inability: Not on file  . Transportation needs    Medical: Not on file    Non-medical: Not on file  Tobacco Use  . Smoking status: Never Smoker  . Smokeless tobacco: Never Used  Substance and Sexual Activity  . Alcohol use: No  . Drug use: No  . Sexual activity: Never  Lifestyle  . Physical activity  Days per week: Not on file    Minutes per session: Not on file  . Stress: Not on file  Relationships  . Social Musician on phone: Not on file    Gets together: Not on file    Attends religious service: Not on file    Active member of club or organization: Not on file    Attends meetings of clubs or organizations: Not on file    Relationship status: Not on file  . Intimate partner violence    Fear of current or ex partner: Not on file    Emotionally abused: Not on file    Physically abused: Not on file    Forced sexual activity: Not on file  Other Topics Concern  . Not on file  Social History Narrative   Patient lives at home with her daughter.   Caffeine Use: some    Review of Systems: See HPI, otherwise negative ROS  Physical Exam: BP 130/75   Pulse 84   Temp 97.8 F (36.6 C) (Oral)   Ht 5\' 5"  (1.651 m)   Wt 243 lb 9.6 oz (110.5 kg)   LMP 03/01/2019 (LMP Unknown)   BMI 40.54 kg/m  General:   Alert,  Well-developed, well-nourished, pleasant and cooperative in NAD Neck:  Supple; no masses or thyromegaly. No significant cervical adenopathy. Abdomen: Non-distended, normal bowel sounds.  Soft and nontender without appreciable mass or hepatosplenomegaly.   Impression/Plan: 43 year old lady with chronic nausea in  the setting of chronic opioid use, GERD and opioid-induced constipation.  Constipation continues to be suboptimally managed.  Agree with  adding symphoric to her regimen will see if that helps evacuate her colon.  Ideally would like to see her have at least a bowel movement every other day or 3 bowel movements weekly.  History of iron deficiency anemia.  Patient declines endoscopic evaluation.  She understands the potential for delay diagnosis of some other process contributing iron deficiency anemia which could negatively impact her health as discussed.  Recommendations:   Continue Phenergan 25 mg suppositories - one in the rectum every 6 hrs as needed (disp 30 with 3 refills)  Omeprazole 40 mg twice daily (disp 60 with 11 refills)  Linzess 290 cap one daily  (disp 30 - 11 refills)  AS discusseed, will not work-up Iron deficiency further with Colonoscopy / EGD as she has decline to have test performed at this time  Trial of Symphoric (prescribed by pain management)  Office visit in 6 months      Notice: This dictation was prepared with Dragon dictation along with smaller phrase technology. Any transcriptional errors that result from this process are unintentional and may not be corrected upon review.

## 2019-03-31 NOTE — Patient Instructions (Signed)
Continue Phenergan 25 mg suppositories - one in the rectum every 6 hrs as needed (disp 30 with 3 refills)  Omeprazole 40 mg twice daily (disp 60 with 11 refills)  Linzess 290 cap one daily  (disp 30 - 11 refills)  AS discusseed, will not work-up Iron deficiency further with Colonoscopy / EGD as you decline to have test performed at this time  Trial of Symphoric (prescribed by pain management)  Office visit in 6 months

## 2019-04-01 ENCOUNTER — Telehealth: Payer: Self-pay | Admitting: Internal Medicine

## 2019-04-01 NOTE — Telephone Encounter (Signed)
Spoke with pt. Pt said [facility] didn't have her RX which was sent into her pharmacy yesterday 03/31/2019. Pt is aware that I will call [facility] and leave a verbal RX. Called [facility] and left a verbal prescription on the RX line.

## 2019-04-01 NOTE — Telephone Encounter (Signed)
PATIENT NEEDS HER PROMETHAZINE PILL PRESCRIPTION SENT TO [facility]

## 2019-04-02 ENCOUNTER — Telehealth: Payer: Self-pay | Admitting: Internal Medicine

## 2019-04-02 NOTE — Telephone Encounter (Signed)
Patient called and said her pharmacy still does not have the prescription for promethazine pills-she sis get her suppositories, just not the other

## 2019-04-02 NOTE — Telephone Encounter (Signed)
Pt was seen 03/31/2019 and is requesting a refill of the  Promethazine pills. Promethazine suppositories has already been called in for pt.

## 2019-04-03 ENCOUNTER — Other Ambulatory Visit: Payer: Self-pay | Admitting: Gastroenterology

## 2019-04-03 MED ORDER — PROMETHAZINE HCL 25 MG PO TABS: 25.0000 mg | ORAL_TABLET | Freq: Three times a day (TID) | ORAL | 0 refills | Status: DC | PRN

## 2019-04-03 NOTE — Telephone Encounter (Signed)
Will send in limited supply. Please let her know she should not take suppositories and oral tablets together.

## 2019-04-06 NOTE — Telephone Encounter (Signed)
Noted, Pt is aware that she can't use both suppositories and oral med at the same time.

## 2019-04-08 IMAGING — XA Imaging study
2 series · 2 of 2 positions shown · non-contrast
Comparison: none

CLINICAL DATA: Herniated lumbar disc. The patient has diffuse low
back and bilateral lower extremity pain. She is unable to
differentiate a specific location of her pain in the right lower
extremity or preference of the right leg versus the left leg. The
L5-S1 lumbar disc is displaced on the right with a paracentral
protrusion. I will treat her on the right at L5-S1 to see if we can
alleviate some of her pain.

[Series 1: ortho adipose · 1 of 1 slices shown (1 of 2)]
[im 1/1]
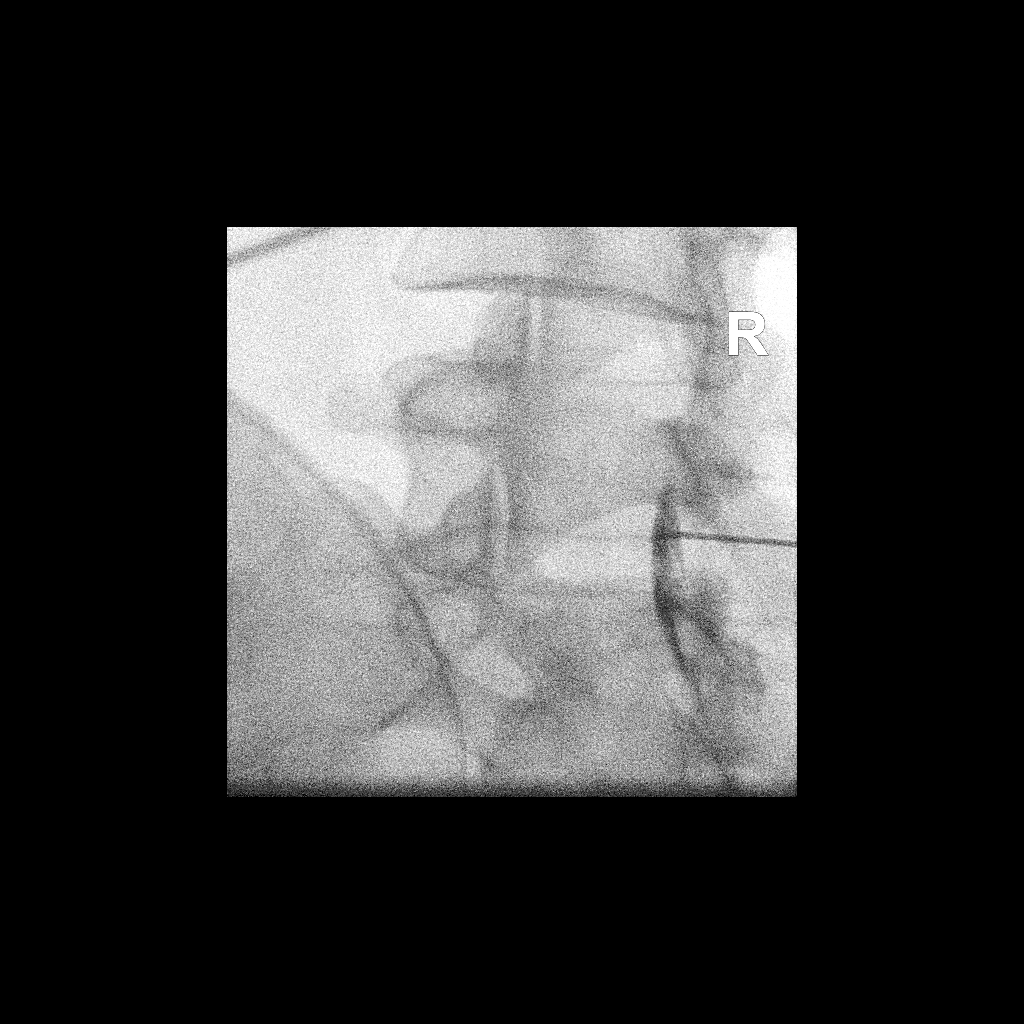

[Series 2: ortho adipose · 1 of 1 slices shown (2 of 2)]
[im 1/1]
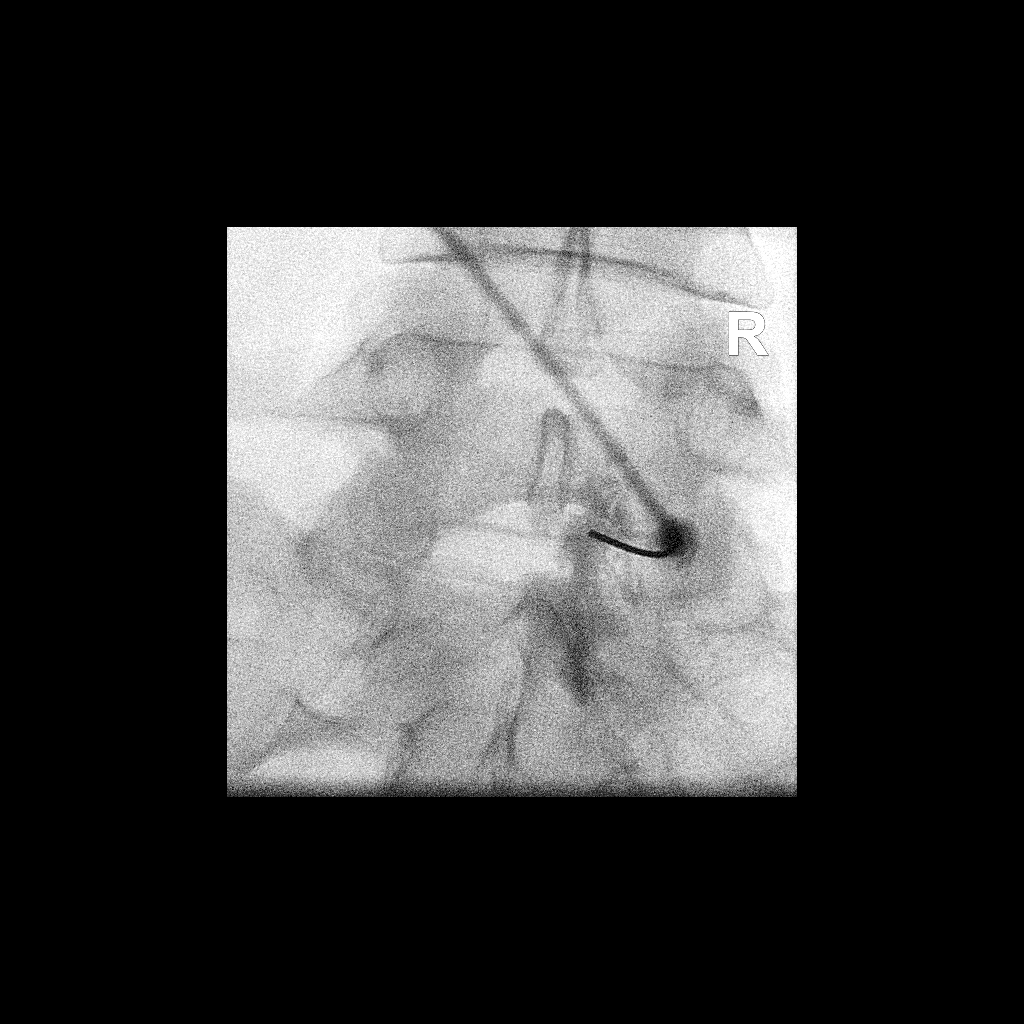

[2 of 2 positions shown; findings below may reference images not displayed]

FLUOROSCOPY TIME:  Radiation Exposure Index (as provided by the
fluoroscopic device): 33.16 uGy*m2

Fluoroscopy Time:  19 seconds

Number of Acquired Images:  0

PROCEDURE:
The procedure, risks, benefits, and alternatives were explained to
the patient. Questions regarding the procedure were encouraged and
answered. The patient understands and consents to the procedure.

LUMBAR EPIDURAL INJECTION:

An interlaminar approach was performed on right at L5-S1. The
overlying skin was cleansed and anesthetized. A 20 gauge epidural
needle was advanced using loss-of-resistance technique.

DIAGNOSTIC EPIDURAL INJECTION:

Injection of Isovue-M 200 shows a good epidural pattern with spread
above and below the level of needle placement, primarily on the
right no vascular opacification is seen.

THERAPEUTIC EPIDURAL INJECTION:

120 Mg of Depo-Medrol mixed with 3 mL 1% lidocaine were instilled.
The procedure was well-tolerated, and the patient was discharged
thirty minutes following the injection in good condition.

COMPLICATIONS:
None
IMPRESSION: Technically successful epidural injection on the right L5-S1 # 1

## 2019-04-15 ENCOUNTER — Telehealth: Payer: Self-pay

## 2019-04-15 NOTE — Telephone Encounter (Signed)
Pt called office, states insurance isn't covering Anucort supp d/t they are less effective. She wants to know if there is something else she can use.

## 2019-04-20 ENCOUNTER — Telehealth: Payer: Self-pay | Admitting: Internal Medicine

## 2019-04-20 MED ORDER — HYDROCORTISONE (PERIANAL) 2.5 % EX CREA: 1.0000 "application " | TOPICAL_CREAM | Freq: Two times a day (BID) | CUTANEOUS | 1 refills | Status: DC

## 2019-04-20 NOTE — Telephone Encounter (Signed)
Pt called asking for LSL. I told her LSL was not here and I could take a message. She said the medication LSL put her on was not helping and she also wanted [place] to do an appeal on her suppository prescription. Please advise. [phone]

## 2019-04-20 NOTE — Telephone Encounter (Signed)
Noted  

## 2019-04-20 NOTE — Telephone Encounter (Signed)
She can use anusol cream for hemorrhoids. I have sent in rx.

## 2019-04-20 NOTE — Telephone Encounter (Signed)
Routing to Leslie

## 2019-04-20 NOTE — Telephone Encounter (Signed)
Patient made aware of Rx

## 2019-04-22 NOTE — Telephone Encounter (Signed)
Lmom, waiting on a return call.  

## 2019-04-29 NOTE — Telephone Encounter (Signed)
Pt returned call and wants an appeal done on the suppositories. Pt is aware that several PA's and Appeals have been made and denied previously for suppositories. Will discuss further with pts insurance company.

## 2019-05-15 ENCOUNTER — Telehealth: Payer: Self-pay | Admitting: Internal Medicine

## 2019-05-15 NOTE — Telephone Encounter (Signed)
Patient called to speak with you, stated you knew what it was about

## 2019-05-15 NOTE — Telephone Encounter (Signed)
Pt returned call and is aware that the suppositories aren't eligible for a tier reduction. Pt advised to see if the pharmacy can give her less of the suppositories to reduce the cost.

## 2019-05-15 NOTE — Telephone Encounter (Signed)
VM received from pt about a formulary exception on the [employer] suppository. Spoke with pts insurance company and the medication doesn't qualify for a formulary exception. The cost is $45.00 and the medication was approved through pts insurance. I asked about other suppositories and if approved under insurance, the cost may be around $45.00 or higher depending on the tier of the medication. The suppositories aren't eligable for a formulary exception.

## 2019-05-15 NOTE — Telephone Encounter (Signed)
See other phone note

## 2019-05-19 ENCOUNTER — Telehealth: Payer: Self-pay | Admitting: Internal Medicine

## 2019-05-19 NOTE — Telephone Encounter (Signed)
Spoke with pt. Pt is aware that Lidocaine isn't a substitute for HC cream and she should schedule an apt to discuss the hemorrhoid issues she's having. Pt declined apt at this time and will call back to schedule an apt.

## 2019-05-19 NOTE — Telephone Encounter (Signed)
Pt wants LSL to prescribe her Lidocaine HCL 4 % and said it was need a prior auth. Please advise. [phone]

## 2019-05-19 NOTE — Telephone Encounter (Signed)
Routing to LSL. I spoke with pt. Pt would like Lidocaine HCL 4% cr sent to her pharmacy. Pt states it will need a PA when sent to her pharmacy. Pt states that she isn't using the HC Anusol cr due to it being expensive.

## 2019-05-19 NOTE — Telephone Encounter (Signed)
Lnon, waiting on a return call.

## 2019-05-19 NOTE — Telephone Encounter (Signed)
Lidocaine is not a substitute for hydrocortisone. She has numerous telephone encounter regarding hemorrhoid medications but does not appear hemorrhoids were discussed at OV last month.   She needs ov for assessment of her rectal issues.

## 2019-09-23 ENCOUNTER — Ambulatory Visit (INDEPENDENT_AMBULATORY_CARE_PROVIDER_SITE_OTHER): Payer: Medicare Other | Admitting: Gastroenterology

## 2019-09-23 ENCOUNTER — Encounter: Payer: Self-pay | Admitting: Gastroenterology

## 2019-09-23 ENCOUNTER — Other Ambulatory Visit: Payer: Self-pay

## 2019-09-23 VITALS — BP 133/78 | HR 85 | Temp 97.1°F | Ht 65.0 in | Wt 255.6 lb

## 2019-09-23 DIAGNOSIS — K649 Unspecified hemorrhoids: Secondary | ICD-10-CM | POA: Insufficient documentation

## 2019-09-23 DIAGNOSIS — K219 Gastro-esophageal reflux disease without esophagitis: Secondary | ICD-10-CM

## 2019-09-23 DIAGNOSIS — D509 Iron deficiency anemia, unspecified: Secondary | ICD-10-CM | POA: Diagnosis not present

## 2019-09-23 DIAGNOSIS — K5903 Drug induced constipation: Secondary | ICD-10-CM | POA: Diagnosis not present

## 2019-09-23 DIAGNOSIS — R110 Nausea: Secondary | ICD-10-CM

## 2019-09-23 MED ORDER — PROMETHAZINE HCL 25 MG RE SUPP: RECTAL | 3 refills | Status: DC

## 2019-09-23 MED ORDER — HYDROCORTISONE ACETATE 25 MG RE SUPP: 25.0000 mg | Freq: Two times a day (BID) | RECTAL | 0 refills | Status: DC

## 2019-09-23 MED ORDER — OMEPRAZOLE 40 MG PO CPDR: 40.0000 mg | DELAYED_RELEASE_CAPSULE | Freq: Two times a day (BID) | ORAL | 11 refills | Status: DC

## 2019-09-23 MED ORDER — LINACLOTIDE 290 MCG PO CAPS: 290.0000 ug | ORAL_CAPSULE | Freq: Every day | ORAL | 11 refills | Status: DC

## 2019-09-23 MED ORDER — PRAMOXINE-HC 1-1 % EX CREA: TOPICAL_CREAM | Freq: Three times a day (TID) | CUTANEOUS | 0 refills | Status: DC

## 2019-09-23 MED ORDER — PROMETHAZINE HCL 25 MG PO TABS: 25.0000 mg | ORAL_TABLET | Freq: Three times a day (TID) | ORAL | 2 refills | Status: DC | PRN

## 2019-09-23 NOTE — Patient Instructions (Signed)
1. Prescriptions filled for omeprazole, Linzess, Phenergan suppositories/pills. 2. Prescription sent for Analpram cream 3 times daily for 14 days for hemorrhoids.  Prescription also sent for Anusol suppositories twice daily for 14 days.  If creams do not help, surgery will likely be your best option. 3. Return to the office in 1 year or call sooner if needed.

## 2019-09-23 NOTE — Progress Notes (Signed)
Primary Care Physician: Monico Blitz, MD  Primary Gastroenterologist:  Garfield Cornea, MD   Chief Complaint  Patient presents with  . Hemorrhoids    occ bleeding  . Constipation    occ    HPI: Shirley Perry is a 44 y.o. female here for follow-up.  Last seen in December 2020.  History of nausea in the setting of chronic opioid use, opioid induced constipation, GERD.  Colonoscopy 10 years ago negative.  History of profound iron deficiency anemia in the setting of heavy menses.  We previously offered a colonoscopy and possible EGD but she declined.  Declined at last office visit as well.  Celiac serologies previously negative.  GES negative in 2012.  GERD has been controlled on omeprazole 40 mg twice daily.  She has been on Linzess 290 mcg daily.  Pain management provider added Symproic as well.Continues to slowly gain weight.  Weight is up 12 pounds in the past 6 months.  25 pounds in the past 3 years.  Patient states she takes her Linzess and Symproic every day. Still has to take 3 bisacodyl at times to have BM. Complains of hemorrhoids. Rectal pain/swelling. No bleeding.states anusol cream not that helpful, prefers a different medication and suppositories. We were unable to get insurance to pay for anusol suppositories previously. No abdominal pain. No heartburn, vomiting, dysphagia. Still having heavy cycle.  Current Outpatient Medications  Medication Sig Dispense Refill  . acetaminophen (TYLENOL) 500 MG tablet Take 1,000 mg by mouth every 6 (six) hours as needed.    Marland Kitchen amitriptyline (ELAVIL) 75 MG tablet Take 75 mg by mouth daily.     Marland Kitchen aspirin EC 81 MG tablet Take 81 mg by mouth daily.    . baclofen (LIORESAL) 10 MG tablet Take 10 mg by mouth 2 (two) times a day. Can take up to 3 times a day if needed    . bisacodyl (DULCOLAX) 5 MG EC tablet 3 tablets every 3 days once a day    . diazepam (VALIUM) 5 MG tablet Take 5 mg by mouth 2 (two) times daily. And can take extra if needed   2  . docusate sodium (COLACE) 100 MG capsule Take 100 mg by mouth daily.     Marland Kitchen docusate sodium ([employer]S STOOL SOFTENER) 250 MG capsule Take 250 mg by mouth daily.    . DULoxetine (CYMBALTA) 30 MG capsule Take 30 mg by mouth daily.    . Fiber POWD Take by mouth. 67ml's 5 times a day    . gabapentin (NEURONTIN) 300 MG capsule TAKE TWO CAPSULES BY MOUTH THREE TIMES DAILY (Patient taking differently: Take 900 mg by mouth 4 (four) times daily. ) 180 capsule 0  . LATUDA 80 MG TABS tablet Take 80 mg by mouth daily.   1  . linaclotide (LINZESS) 290 MCG CAPS capsule Take 1 capsule (290 mcg total) by mouth daily before breakfast. 30 capsule 11  .     5  . meloxicam (MOBIC) 15 MG tablet Take 15 mg by mouth daily.    . methylPREDNISolone (MEDROL DOSEPAK) 4 MG TBPK tablet Once a month as needed    . Naldemedine Tosylate (SYMPROIC) 0.2 MG TABS Take by mouth daily.    Marland Kitchen nystatin cream (MYCOSTATIN) APPLY TO AFFECTED AREA(S) TWICE DAILY (Patient taking differently: 2 (two) times daily as needed. ) 30 g 0  .     5  . omeprazole (PRILOSEC) 40 MG capsule Take 1 capsule (40 mg total)  by mouth 2 (two) times daily. 60 capsule 11  . oxybutynin (DITROPAN-XL) 10 MG 24 hr tablet Take 20 mg by mouth at bedtime.    . pentosan polysulfate (ELMIRON) 100 MG capsule Take 100 mg by mouth 3 (three) times daily before meals.      Marland Kitchen PRESCRIPTION MEDICATION Levothyroxine 12.43mcg po daily    . promethazine (PHENERGAN) 25 MG suppository INSERT ONE SUPPOSITORY RECTALLY EVERY 6 HOURS AS NEEDED FOR NAUSEA AND VOMITING 30 suppository 3  . promethazine (PHENERGAN) 25 MG tablet Take 1 tablet (25 mg total) by mouth every 8 (eight) hours as needed for nausea or vomiting. 20 tablet 0  . traMADol (ULTRAM) 50 MG tablet Take 50-100 mg by mouth every 8 (eight) hours as needed.    . triamcinolone (KENALOG) 0.025 % cream Apply 1 application topically 2 (two) times daily as needed.   12  . VENTOLIN HFA 108 (90 Base) MCG/ACT inhaler INHALE TWO PUFFS  EVERY 6 HOURS AS NEEDED WHEEZING SHORTNESS OF BREATH 18 g 2  . hydrocortisone (ANUSOL-HC) 2.5 % rectal cream Place 1 application rectally 2 (two) times daily. For 10 days (Patient not taking: Reported on 09/23/2019) 30 g 1  . levothyroxine (SYNTHROID, LEVOTHROID) 100 MCG tablet Take 1 tablet (100 mcg total) by mouth daily before breakfast. (Patient not taking: Reported on 09/23/2019) 90 tablet 3  . oxybutynin (DITROPAN XL) 15 MG 24 hr tablet Take 15 mg by mouth daily.      No current facility-administered medications for this visit.    Allergies as of 09/23/2019  . (No Known Allergies)    ROS:  General: Negative for anorexia, weight loss, fever, chills, fatigue, weakness. ENT: Negative for hoarseness, difficulty swallowing , nasal congestion. CV: Negative for chest pain, angina, palpitations, dyspnea on exertion, peripheral edema.  Respiratory: Negative for dyspnea at rest, dyspnea on exertion, cough, sputum, wheezing.  GI: See history of present illness. GU:  Negative for dysuria, hematuria, urinary incontinence, urinary frequency, nocturnal urination.  Endo: Negative for unusual weight change.    Physical Examination:   BP 133/78   Pulse 85   Temp (!) 97.1 F (36.2 C) (Temporal)   Ht 5\' 5"  (1.651 m)   Wt 255 lb 9.6 oz (115.9 kg)   LMP 08/06/2019   BMI 42.53 kg/m   General: Well-nourished, well-developed in no acute distress.  Eyes: No icterus. Mouth: masked  Abdomen: Bowel sounds are normal, nontender, nondistended, no hepatosplenomegaly or masses, no abdominal bruits or hernia , no rebound or guarding.   Rectal: patient refused Extremities: No lower extremity edema. No clubbing or deformities. Neuro: Alert and oriented x 4   Skin: Warm and dry, no jaundice.   Psych: Alert and cooperative, normal mood and affect. Imaging Studies: No results found.

## 2019-09-24 ENCOUNTER — Telehealth: Payer: Self-pay | Admitting: Emergency Medicine

## 2019-09-24 MED ORDER — HYDROCORTISONE (PERIANAL) 2.5 % EX CREA: 1.0000 "application " | TOPICAL_CREAM | Freq: Two times a day (BID) | CUTANEOUS | 0 refills | Status: DC

## 2019-09-24 NOTE — Assessment & Plan Note (Signed)
Chronic IDA, has required blood transfusion in the past. In setting of heavy menses. Patient has declined colonoscopy on multiple occasions. Continues to deny brbpr or melena. Will obtain copy of latest labs from PCP.

## 2019-09-24 NOTE — Assessment & Plan Note (Signed)
Patient would not allow rectal exam. We have been empirically treated with topical hydrocortisone off/on with modest improvement. Patient prefers suppositories but previously not covered by insurance. Will try sending in hydrocortisone suppositories as well analpram cream. Ultimately she may require surgical intervention or banding but without rectal exam it is difficult to provide input.

## 2019-09-24 NOTE — Assessment & Plan Note (Signed)
Continues to be difficult to manage. Continue current regimen for now per patient request. If further decline in bowel function she should let me know. Of note, patient has been offered colonoscopy but declines.

## 2019-09-24 NOTE — Assessment & Plan Note (Signed)
Doing well but requiring omeprazole 40mg  bid. Reinforced antireflux measures. Return to office in one year.

## 2019-09-24 NOTE — Telephone Encounter (Signed)
I have sent in the alternative listed by Epic that should be covered but it's what she was on in the past. Hydrocortisone cream.   Unfortunately she would not allow rectal exam yesterday so I'm treating hemorrhoids based on her description. Her hemorrhoids may need surgical intervention by general surgery as creams only help reduce symptoms. She may be candidate for hemorrhoid banding in our office but she would have to allow for rectal exam to determine.

## 2019-09-24 NOTE — Assessment & Plan Note (Signed)
Stable on prn phenergan.

## 2019-09-24 NOTE — Telephone Encounter (Signed)
I sent in six meds yesterday. Please verify which meds are being denied.   I suspect its the hemorrhoid suppositories and cream which I told her yesterday that insurance probably will not cover but she wanted something besides anusol.

## 2019-09-24 NOTE — Addendum Note (Signed)
Addended by: Tiffany Kocher on: 09/24/2019 04:01 PM   Modules accepted: Orders

## 2019-09-24 NOTE — Telephone Encounter (Signed)
Pt called and stated insurance will not cover medications. I called [facility] to see if medications needed a PA but they stated the medications do not need a PA insurance is just denying them and they will not pay. Is there anything we are able to send in for the pt?

## 2019-09-24 NOTE — Telephone Encounter (Signed)
Pt states the medications that are being denied are  pramoxine-hydrocortisone (ANALPRAM HC) cream hydrocortisone (ANUSOL-HC) 25 MG suppository

## 2019-09-25 NOTE — Telephone Encounter (Signed)
Pt.notified

## 2019-09-28 ENCOUNTER — Telehealth: Payer: Self-pay | Admitting: Internal Medicine

## 2019-09-28 NOTE — Telephone Encounter (Signed)
Pt called to say that [facility] hasn't received her Rx for a cream and a suppository. Please call [phone]

## 2019-09-28 NOTE — Telephone Encounter (Signed)
Medication was sent to [facility] on 09/23/19. [facility] and gave a verbal of RX. Spoke with pt and she stated she doesn't need those RX called in since insurance wont cover it. Pt will call back if needed and is aware that meds were called in if she needs then.

## 2019-10-05 ENCOUNTER — Ambulatory Visit: Payer: Medicare Other | Admitting: Gastroenterology

## 2019-11-28 ENCOUNTER — Other Ambulatory Visit: Payer: Self-pay | Admitting: Gastroenterology

## 2019-12-15 ENCOUNTER — Telehealth: Payer: Self-pay | Admitting: Internal Medicine

## 2019-12-15 NOTE — Telephone Encounter (Signed)
[phone]  Patient called and said that she left a message a few weeks ago regarding suppository being denied.  Needs to be filled for nausea, insurance stating they are filled for hemorrhoids

## 2019-12-16 NOTE — Telephone Encounter (Signed)
Left a detailed message for pt. Another PA was submitted on 12/08/19 and was cancelled with the dx n/v. I submitted another PA today with dx n/v. Pt is aware that she will be contacted when PA has been approved or denied.

## 2019-12-17 NOTE — Telephone Encounter (Signed)
I had completed previous PA on 12/08/19 and got response from the insurance that states the medication was denied on 11/19/19 and we cannot request a new PA for 61 days. It did state we could do an appeal letter.  Verlon Au, do you want to do an appeal letter for the promethazine suppositories?

## 2019-12-17 NOTE — Telephone Encounter (Signed)
No appeal letter. The denial letter stated we were not prescribing for an FDA approved diagnosis that is in accordance with Medicare. The information I have found is that is is FDA approved for acute N/V. She is using it for chronic N/V. I would recommend she use oral phenergan for nausea. We could provide oral zofran. And the issue can be addressed further at upcoming ov if needed.

## 2019-12-17 NOTE — Telephone Encounter (Signed)
Noted  

## 2019-12-17 NOTE — Telephone Encounter (Signed)
I spoke with the pt, she said she was going to try to appeal the decision with the insurance company herself and see if she can get it approved. She will call us back and let us know if she wants [place] to send in the zofran.

## 2019-12-17 NOTE — Telephone Encounter (Signed)
noted 

## 2019-12-31 ENCOUNTER — Telehealth: Payer: Self-pay | Admitting: Gastroenterology

## 2019-12-31 NOTE — Telephone Encounter (Signed)
Please request labs labs from PCP. Need specifically anemia labs.

## 2020-01-01 NOTE — Telephone Encounter (Signed)
Requested.

## 2020-02-01 ENCOUNTER — Ambulatory Visit: Payer: Medicare Other | Admitting: Gastroenterology

## 2020-02-05 ENCOUNTER — Other Ambulatory Visit: Payer: Self-pay | Admitting: Gastroenterology

## 2020-02-05 ENCOUNTER — Other Ambulatory Visit: Payer: Self-pay | Admitting: Nurse Practitioner

## 2020-02-07 NOTE — H&P (View-Only) (Signed)
Referring Provider: Kirstie Peri, MD Primary Care Physician:  Kirstie Peri, MD Primary GI Physician: Dr. Jena Gauss  No chief complaint on file.   HPI:   Shirley Perry is a 44 y.o. female presenting today to discuss scheduling EGD/TCS. Last seen in June 2021.  History of nausea in the setting of chronic opioid use, opioid induced constipation, and GERD.  Last colonoscopy in 2011 negative.  History of profound iron deficiency anemia in the setting of heavy menses.  We previously offered a colonoscopy and possible EGD but she declined. Celiac serologies previously negative.  GES negative in 2012.  At her last visit, GERD was well controlled on omeprazole 40 mg BID.  She was taking Symproic and Linzess for constipation but still required 3 bisacodyl at times to have a BM.  Complains about hemorrhoids and rectal pain/swelling with no bleeding.  Anusol cream not helpful.  She refused rectal exam.  She again declined colonoscopy.  Plan to continue current medications, send in hydrocortisone suppositories as well as analpram cream.  Stated surgery would likely be best option for hemorrhoid management if supportive creams were not helpful.  Plan to follow-up in 1 year.  Today:  States she scheduled her appointment today as she is ready to proceed with EGD and colonoscopy.  She came with a long list of questions regarding procedures and they were all answered.  She requested that she is "put to sleep" for her procedures.  Nausea/intermittent vomiting: Chronic.  This has actually improved somewhat since stopping gabapentin. Currently on Lyrica.   Generalized abdominal pain is chronic and intermittent. Occurs randomly. Maybe every few weeks. Worse if she hasn't moved her bowels in a while. This is also a better since stopping gabapentin.   Constipation: Not adequately controlled.  Taking Linzess 290 mcg daily, Symproic daily, stool softener daily, and still requiring laxatives every 3 days to have a bowel  movement.  Reports stools are hard and less taking the additional laxatives.  Sometimes she feels she needs to have a BM but cannot.  Continues with GERD symptoms daily. Taking omeprazole BID. Reports being on Dexilant, Nexium, Prevacid. Doesn't remember Protonix. Doesn't want to change PPI.  No dysphagia.  Intermittent upper abdominal pain/bloating, chronic, occasionally affected by meals.  Taking meloxicam on a routine basis for arthritis.  Anemia: Last labs were in March of this year. Not sure what her hemoglobin or iron was at that point. Continues with heavy menstrual cycles. Had a couple episodes of rectal bleeding. States, "it isn't much". Last episode about 4 months ago. This has been chronic and intermittent for years.  No rectal pain.  No black stool. Prefers to hold off on updating labs until preop for procedures.  Denies fever, chills, cold or flulike symptoms, lightheadedness, dizziness, presyncope, syncope, chest pain, heart palpitations.  Admits to intermittent shortness of breath.  No routine cough.  No hematuria.  Past Medical History:  Diagnosis Date  . Allergic rhinitis   . Asthma   . Chronic constipation   . Depression   . GERD (gastroesophageal reflux disease)   . Hiatal hernia   . Hypothyroidism   . Interstitial cystitis   . Irritable bowel syndrome   . Obesity   . OSA on CPAP   . Overactive bladder   . Paranoia (HCC)   . Psoriasis     Past Surgical History:  Procedure Laterality Date  . BLADDER REPAIR    . CHOLECYSTECTOMY  1999  . COLONOSCOPY  2011  single anal papilla  . ESOPHAGOGASTRODUODENOSCOPY  2006   patulous EGJ, small hh  . ESOPHAGOGASTRODUODENOSCOPY  11/10/2010   MEQ:ASTMHD esophagus/patulous EG junction/small HH  . TUBAL LIGATION Bilateral     Current Outpatient Medications  Medication Sig Dispense Refill  . acetaminophen (TYLENOL) 500 MG tablet Take 1,000 mg by mouth every 6 (six) hours as needed.    Marland Kitchen aspirin EC 81 MG tablet Take 81 mg by  mouth daily.    . baclofen (LIORESAL) 10 MG tablet Take 10 mg by mouth 2 (two) times a day. Can take up to 3 times a day if needed    . bisacodyl (DULCOLAX) 5 MG EC tablet 3 tablets every 3 days once a day    . diazepam (VALIUM) 5 MG tablet Take 5 mg by mouth 2 (two) times daily. And can take extra if needed  2  . docusate sodium (COLACE) 100 MG capsule Take 100 mg by mouth daily.     Marland Kitchen docusate sodium ([employer] STOOL SOFTENER) 250 MG capsule Take 250 mg by mouth daily.    . Fiber POWD Take by mouth. 7ml's 5 times a day    . LATUDA 80 MG TABS tablet Take 80 mg by mouth daily.   1  . levothyroxine (SYNTHROID, LEVOTHROID) 100 MCG tablet Take 1 tablet (100 mcg total) by mouth daily before breakfast. (Patient taking differently: Take 100 mcg by mouth daily before breakfast. Taking 50 mcg daily) 90 tablet 3  . linaclotide (LINZESS) 290 MCG CAPS capsule Take 1 capsule (290 mcg total) by mouth daily before breakfast. 30 capsule 11  . meloxicam (MOBIC) 15 MG tablet Take 15 mg by mouth daily.    Marcene Duos Tosylate (SYMPROIC) 0.2 MG TABS Take by mouth daily.    Marland Kitchen nystatin cream (MYCOSTATIN) APPLY TO AFFECTED AREA(S) TWICE DAILY (Patient taking differently: 2 (two) times daily as needed. ) 30 g 0  . omeprazole (PRILOSEC) 40 MG capsule Take 1 capsule (40 mg total) by mouth 2 (two) times daily. 60 capsule 11  . pentosan polysulfate (ELMIRON) 100 MG capsule Take 100 mg by mouth 3 (three) times daily before meals.      . promethazine (PHENERGAN) 25 MG suppository INSERT ONE SUPPOSITORY RECTALLY EVERY 6 HOURS AS NEEDED FOR NAUSEA AND VOMITING 30 suppository 3  . traMADol (ULTRAM) 50 MG tablet Take 50-100 mg by mouth every 8 (eight) hours as needed.    . VENTOLIN HFA 108 (90 Base) MCG/ACT inhaler INHALE TWO PUFFS EVERY 6 HOURS AS NEEDED WHEEZING SHORTNESS OF BREATH 18 g 2  . methylPREDNISolone (MEDROL DOSEPAK) 4 MG TBPK tablet Once a month as needed (Patient not taking: Reported on 02/08/2020)    . polyethylene  glycol-electrolytes (NULYTELY) 420 g solution As directed 8000 mL 0  . promethazine (PHENERGAN) 25 MG tablet TAKE 1 TABLET BY MOUTH EVERY 8 HOURS AS NEEDED FOR NAUSEA AND VOMITING 20 tablet 2   No current facility-administered medications for this visit.    Allergies as of 02/08/2020 - Review Complete 09/23/2019  Allergen Reaction Noted  . Amitiza [lubiprostone]  02/08/2020    Family History  Problem Relation Age of Onset  . Ulcers Mother   . Breast cancer Mother   . Diabetes Father   . Sleep apnea Sister   . Anesthesia problems Neg Hx   . Hypotension Neg Hx   . Malignant hyperthermia Neg Hx   . Pseudochol deficiency Neg Hx   . Colon cancer Neg Hx     Social  History   Socioeconomic History  . Marital status: Divorced    Spouse name: Not on file  . Number of children: 2  . Years of education: College  . Highest education level: Not on file  Occupational History  . Occupation: unemployed    Associate Professor: UNEMPLOYED    Employer: NOT EMPLOYED  Tobacco Use  . Smoking status: Never Smoker  . Smokeless tobacco: Never Used  Substance and Sexual Activity  . Alcohol use: No  . Drug use: No  . Sexual activity: Never  Other Topics Concern  . Not on file  Social History Narrative   Patient lives at home with her daughter.   Caffeine Use: some   Social Determinants of Health   Financial Resource Strain:   . Difficulty of Paying Living Expenses: Not on file  Food Insecurity:   . Worried About Programme researcher, broadcasting/film/video in the Last Year: Not on file  . Ran Out of Food in the Last Year: Not on file  Transportation Needs:   . Lack of Transportation (Medical): Not on file  . Lack of Transportation (Non-Medical): Not on file  Physical Activity:   . Days of Exercise per Week: Not on file  . Minutes of Exercise per Session: Not on file  Stress:   . Feeling of Stress : Not on file  Social Connections:   . Frequency of Communication with Friends and Family: Not on file  . Frequency of  Social Gatherings with Friends and Family: Not on file  . Attends Religious Services: Not on file  . Active Member of Clubs or Organizations: Not on file  . Attends Banker Meetings: Not on file  . Marital Status: Not on file    Review of Systems: Gen: See HPI CV: See HPI Resp: See HPI GI: See HPI Heme: See HPI  Physical Exam: BP (!) 151/83   Pulse 64   Temp (!) 97.5 F (36.4 C) (Temporal)   Ht 5\' 5"  (1.651 m)   Wt 236 lb 9.6 oz (107.3 kg)   BMI 39.37 kg/m  General:   Alert and oriented. No distress noted. Pleasant and cooperative.  Head:  Normocephalic and atraumatic. Eyes:  Conjuctiva clear without scleral icterus. Heart:  S1, S2 present without murmurs appreciated. Lungs:  Clear to auscultation bilaterally. No wheezes, rales, or rhonchi. No distress.  Abdomen:  +BS, soft, non-tender and non-distended. No rebound or guarding. No HSM or masses noted. Msk:  Symmetrical without gross deformities. Normal posture. Extremities:  Without edema. Neurologic:  Alert and  oriented x4 Psych:  Normal mood and affect.

## 2020-02-07 NOTE — Progress Notes (Signed)
Referring Provider: Kirstie Peri, MD Primary Care Physician:  Kirstie Peri, MD Primary GI Physician: Dr. Jena Gauss  No chief complaint on file.   HPI:   Shirley Perry is a 44 y.o. female presenting today to discuss scheduling EGD/TCS. Last seen in June 2021.  History of nausea in the setting of chronic opioid use, opioid induced constipation, and GERD.  Last colonoscopy in 2011 negative.  History of profound iron deficiency anemia in the setting of heavy menses.  We previously offered a colonoscopy and possible EGD but she declined. Celiac serologies previously negative.  GES negative in 2012.  At her last visit, GERD was well controlled on omeprazole 40 mg BID.  She was taking Symproic and Linzess for constipation but still required 3 bisacodyl at times to have a BM.  Complains about hemorrhoids and rectal pain/swelling with no bleeding.  Anusol cream not helpful.  She refused rectal exam.  She again declined colonoscopy.  Plan to continue current medications, send in hydrocortisone suppositories as well as analpram cream.  Stated surgery would likely be best option for hemorrhoid management if supportive creams were not helpful.  Plan to follow-up in 1 year.  Today:  States she scheduled her appointment today as she is ready to proceed with EGD and colonoscopy.  She came with a long list of questions regarding procedures and they were all answered.  She requested that she is "put to sleep" for her procedures.  Nausea/intermittent vomiting: Chronic.  This has actually improved somewhat since stopping gabapentin. Currently on Lyrica.   Generalized abdominal pain is chronic and intermittent. Occurs randomly. Maybe every few weeks. Worse if she hasn't moved her bowels in a while. This is also a better since stopping gabapentin.   Constipation: Not adequately controlled.  Taking Linzess 290 mcg daily, Symproic daily, stool softener daily, and still requiring laxatives every 3 days to have a bowel  movement.  Reports stools are hard and less taking the additional laxatives.  Sometimes she feels she needs to have a BM but cannot.  Continues with GERD symptoms daily. Taking omeprazole BID. Reports being on Dexilant, Nexium, Prevacid. Doesn't remember Protonix. Doesn't want to change PPI.  No dysphagia.  Intermittent upper abdominal pain/bloating, chronic, occasionally affected by meals.  Taking meloxicam on a routine basis for arthritis.  Anemia: Last labs were in March of this year. Not sure what her hemoglobin or iron was at that point. Continues with heavy menstrual cycles. Had a couple episodes of rectal bleeding. States, "it isn't much". Last episode about 4 months ago. This has been chronic and intermittent for years.  No rectal pain.  No black stool. Prefers to hold off on updating labs until preop for procedures.  Denies fever, chills, cold or flulike symptoms, lightheadedness, dizziness, presyncope, syncope, chest pain, heart palpitations.  Admits to intermittent shortness of breath.  No routine cough.  No hematuria.  Past Medical History:  Diagnosis Date  . Allergic rhinitis   . Asthma   . Chronic constipation   . Depression   . GERD (gastroesophageal reflux disease)   . Hiatal hernia   . Hypothyroidism   . Interstitial cystitis   . Irritable bowel syndrome   . Obesity   . OSA on CPAP   . Overactive bladder   . Paranoia (HCC)   . Psoriasis     Past Surgical History:  Procedure Laterality Date  . BLADDER REPAIR    . CHOLECYSTECTOMY  1999  . COLONOSCOPY  2011  single anal papilla  . ESOPHAGOGASTRODUODENOSCOPY  2006   patulous EGJ, small hh  . ESOPHAGOGASTRODUODENOSCOPY  11/10/2010   MEQ:ASTMHD esophagus/patulous EG junction/small HH  . TUBAL LIGATION Bilateral     Current Outpatient Medications  Medication Sig Dispense Refill  . acetaminophen (TYLENOL) 500 MG tablet Take 1,000 mg by mouth every 6 (six) hours as needed.    Marland Kitchen aspirin EC 81 MG tablet Take 81 mg by  mouth daily.    . baclofen (LIORESAL) 10 MG tablet Take 10 mg by mouth 2 (two) times a day. Can take up to 3 times a day if needed    . bisacodyl (DULCOLAX) 5 MG EC tablet 3 tablets every 3 days once a day    . diazepam (VALIUM) 5 MG tablet Take 5 mg by mouth 2 (two) times daily. And can take extra if needed  2  . docusate sodium (COLACE) 100 MG capsule Take 100 mg by mouth daily.     Marland Kitchen docusate sodium ([employer]S STOOL SOFTENER) 250 MG capsule Take 250 mg by mouth daily.    . Fiber POWD Take by mouth. 7ml's 5 times a day    . LATUDA 80 MG TABS tablet Take 80 mg by mouth daily.   1  . levothyroxine (SYNTHROID, LEVOTHROID) 100 MCG tablet Take 1 tablet (100 mcg total) by mouth daily before breakfast. (Patient taking differently: Take 100 mcg by mouth daily before breakfast. Taking 50 mcg daily) 90 tablet 3  . linaclotide (LINZESS) 290 MCG CAPS capsule Take 1 capsule (290 mcg total) by mouth daily before breakfast. 30 capsule 11  . meloxicam (MOBIC) 15 MG tablet Take 15 mg by mouth daily.    Marcene Duos Tosylate (SYMPROIC) 0.2 MG TABS Take by mouth daily.    Marland Kitchen nystatin cream (MYCOSTATIN) APPLY TO AFFECTED AREA(S) TWICE DAILY (Patient taking differently: 2 (two) times daily as needed. ) 30 g 0  . omeprazole (PRILOSEC) 40 MG capsule Take 1 capsule (40 mg total) by mouth 2 (two) times daily. 60 capsule 11  . pentosan polysulfate (ELMIRON) 100 MG capsule Take 100 mg by mouth 3 (three) times daily before meals.      . promethazine (PHENERGAN) 25 MG suppository INSERT ONE SUPPOSITORY RECTALLY EVERY 6 HOURS AS NEEDED FOR NAUSEA AND VOMITING 30 suppository 3  . traMADol (ULTRAM) 50 MG tablet Take 50-100 mg by mouth every 8 (eight) hours as needed.    . VENTOLIN HFA 108 (90 Base) MCG/ACT inhaler INHALE TWO PUFFS EVERY 6 HOURS AS NEEDED WHEEZING SHORTNESS OF BREATH 18 g 2  . methylPREDNISolone (MEDROL DOSEPAK) 4 MG TBPK tablet Once a month as needed (Patient not taking: Reported on 02/08/2020)    . polyethylene  glycol-electrolytes (NULYTELY) 420 g solution As directed 8000 mL 0  . promethazine (PHENERGAN) 25 MG tablet TAKE 1 TABLET BY MOUTH EVERY 8 HOURS AS NEEDED FOR NAUSEA AND VOMITING 20 tablet 2   No current facility-administered medications for this visit.    Allergies as of 02/08/2020 - Review Complete 09/23/2019  Allergen Reaction Noted  . Amitiza [lubiprostone]  02/08/2020    Family History  Problem Relation Age of Onset  . Ulcers Mother   . Breast cancer Mother   . Diabetes Father   . Sleep apnea Sister   . Anesthesia problems Neg Hx   . Hypotension Neg Hx   . Malignant hyperthermia Neg Hx   . Pseudochol deficiency Neg Hx   . Colon cancer Neg Hx     Social  History   Socioeconomic History  . Marital status: Divorced    Spouse name: Not on file  . Number of children: 2  . Years of education: College  . Highest education level: Not on file  Occupational History  . Occupation: unemployed    Associate Professor: UNEMPLOYED    Employer: NOT EMPLOYED  Tobacco Use  . Smoking status: Never Smoker  . Smokeless tobacco: Never Used  Substance and Sexual Activity  . Alcohol use: No  . Drug use: No  . Sexual activity: Never  Other Topics Concern  . Not on file  Social History Narrative   Patient lives at home with her daughter.   Caffeine Use: some   Social Determinants of Health   Financial Resource Strain:   . Difficulty of Paying Living Expenses: Not on file  Food Insecurity:   . Worried About Programme researcher, broadcasting/film/video in the Last Year: Not on file  . Ran Out of Food in the Last Year: Not on file  Transportation Needs:   . Lack of Transportation (Medical): Not on file  . Lack of Transportation (Non-Medical): Not on file  Physical Activity:   . Days of Exercise per Week: Not on file  . Minutes of Exercise per Session: Not on file  Stress:   . Feeling of Stress : Not on file  Social Connections:   . Frequency of Communication with Friends and Family: Not on file  . Frequency of  Social Gatherings with Friends and Family: Not on file  . Attends Religious Services: Not on file  . Active Member of Clubs or Organizations: Not on file  . Attends Banker Meetings: Not on file  . Marital Status: Not on file    Review of Systems: Gen: See HPI CV: See HPI Resp: See HPI GI: See HPI Heme: See HPI  Physical Exam: BP (!) 151/83   Pulse 64   Temp (!) 97.5 F (36.4 C) (Temporal)   Ht 5\' 5"  (1.651 m)   Wt 236 lb 9.6 oz (107.3 kg)   BMI 39.37 kg/m  General:   Alert and oriented. No distress noted. Pleasant and cooperative.  Head:  Normocephalic and atraumatic. Eyes:  Conjuctiva clear without scleral icterus. Heart:  S1, S2 present without murmurs appreciated. Lungs:  Clear to auscultation bilaterally. No wheezes, rales, or rhonchi. No distress.  Abdomen:  +BS, soft, non-tender and non-distended. No rebound or guarding. No HSM or masses noted. Msk:  Symmetrical without gross deformities. Normal posture. Extremities:  Without edema. Neurologic:  Alert and  oriented x4 Psych:  Normal mood and affect.

## 2020-02-08 ENCOUNTER — Ambulatory Visit (INDEPENDENT_AMBULATORY_CARE_PROVIDER_SITE_OTHER): Payer: Medicare Other | Admitting: Gastroenterology

## 2020-02-08 ENCOUNTER — Other Ambulatory Visit: Payer: Self-pay

## 2020-02-08 ENCOUNTER — Encounter: Payer: Self-pay | Admitting: Gastroenterology

## 2020-02-08 VITALS — BP 151/83 | HR 64 | Temp 97.5°F | Ht 65.0 in | Wt 236.6 lb

## 2020-02-08 DIAGNOSIS — K625 Hemorrhage of anus and rectum: Secondary | ICD-10-CM | POA: Diagnosis not present

## 2020-02-08 DIAGNOSIS — R1013 Epigastric pain: Secondary | ICD-10-CM

## 2020-02-08 DIAGNOSIS — R112 Nausea with vomiting, unspecified: Secondary | ICD-10-CM | POA: Diagnosis not present

## 2020-02-08 DIAGNOSIS — D509 Iron deficiency anemia, unspecified: Secondary | ICD-10-CM | POA: Diagnosis not present

## 2020-02-08 DIAGNOSIS — K5903 Drug induced constipation: Secondary | ICD-10-CM

## 2020-02-08 DIAGNOSIS — K219 Gastro-esophageal reflux disease without esophagitis: Secondary | ICD-10-CM | POA: Diagnosis not present

## 2020-02-08 NOTE — Patient Instructions (Addendum)
We will get you scheduled for an upper endoscopy and colonoscopy in the near future with Dr. Jena Gauss. We will plan to complete an extended prep. 2 days prior to procedure follow a soft diet in the morning and start clear liquids at lunch.  Continue clear liquids the day prior to your colonoscopy. We will also complete 1.5 bowel preps.  We will give you specific instructions on this.  For constipation: As you requested, continue Symproic and Linzess for now. If you would like to try Movantik, please let me know. You may continue stool softener daily. I recommend you add MiraLAX 1 capful (17 g) daily in 8 ounces of water to help with bowel regularity.  For reflux: As requested, continue omeprazole 40 mg twice daily 30 minutes before breakfast and dinner. If you would like to try Protonix rather than omeprazole, please let me know. Follow a GERD diet:  Avoid fried, fatty, greasy, spicy, citrus foods. Avoid caffeine and carbonated beverages. Avoid chocolate. Try eating 4-6 small meals a day rather than 3 large meals. Do not eat within 3 hours of laying down. Prop head of bed up on wood or bricks to create a 6 inch incline.  I recommend you limit meloxicam as much as possible and avoid all other NSAIDs including ibuprofen, Aleve, Advil, Goody powders, BC powders, naproxen, and anything that says "NSAID" on the package.  Continue to use Phenergan as needed for nausea/vomiting.  Follow-up after procedure with Tana Coast, PA-C.   Ermalinda Memos, PA-C [facility]

## 2020-02-09 ENCOUNTER — Telehealth: Payer: Self-pay | Admitting: Internal Medicine

## 2020-02-09 ENCOUNTER — Telehealth: Payer: Self-pay | Admitting: *Deleted

## 2020-02-09 DIAGNOSIS — K5900 Constipation, unspecified: Secondary | ICD-10-CM

## 2020-02-09 DIAGNOSIS — D509 Iron deficiency anemia, unspecified: Secondary | ICD-10-CM

## 2020-02-09 DIAGNOSIS — R112 Nausea with vomiting, unspecified: Secondary | ICD-10-CM

## 2020-02-09 NOTE — Telephone Encounter (Signed)
Pt returning call. [phone]

## 2020-02-09 NOTE — Telephone Encounter (Signed)
Patient called. She states she needs a Monday. Advised I am awaiting Jan schedule to schedule for a Monday. Aware will call once I receive this

## 2020-02-09 NOTE — Telephone Encounter (Signed)
LMOVM to call back to schedule tcs/egd , propofol, Dr. Jena Gauss, asa 2

## 2020-02-09 NOTE — Telephone Encounter (Signed)
See prior note

## 2020-02-10 MED ORDER — PEG 3350-KCL-NA BICARB-NACL 420 G PO SOLR: ORAL | 0 refills | Status: DC

## 2020-02-10 NOTE — Telephone Encounter (Signed)
Patient retirned call. She wants to go ahead and schedule procedures. She has been scheduled for 11/11 at 10:15am. Aware will mail covid test with prep instructions. Advised she will have labs done same day as she gets the covid test done in the lab.

## 2020-02-10 NOTE — Addendum Note (Signed)
Addended by: Armstead Peaks on: 02/10/2020 09:14 AM   Modules accepted: Orders

## 2020-02-14 ENCOUNTER — Telehealth: Payer: Self-pay | Admitting: Gastroenterology

## 2020-02-14 NOTE — Telephone Encounter (Signed)
Reviewed labs completed 07/15/2019: CBC: WBC 4.4, hemoglobin 7.7, hematocrit 28.6, MCV 64, MCH 17.2, MCHC 26.9, platelets 312 Iron panel: TIBC 530, iron 11, saturation 2%, ferritin 2. CMP: Glucose 79, BUN 5, creatinine 0.54, sodium 140, potassium 4.1, chloride 103, calcium 8.9, albumin 4.8, total bilirubin <0.2, alk phos 119, AST 20, ALT 19 Lipid panel: Total cholesterol 192, to glycerides 94, HDL 70, LDL 105 TSH 0.92  10/06/2019 TSH 2.55  2/47/9980 TSH 4.9  Alicia: Please let patient know I have received her labs from PCP.  Last hemoglobin was 7.7 in March 2021.  Iron panel is also very low. She preferred to hold off on labs until pre-up for EGD/TCS. Considering her hemoglobin was quite low, I would like for her to go ahead and have labs completed.   Please also ask patient if she is taking iron.

## 2020-02-15 ENCOUNTER — Encounter: Payer: Self-pay | Admitting: Gastroenterology

## 2020-02-15 NOTE — Telephone Encounter (Signed)
Lmom, waiting on a return call.  

## 2020-02-15 NOTE — Assessment & Plan Note (Addendum)
Chronic history of nausea with intermittent vomiting in the setting of chronic opioid use, chronic constipation, GERD, and intermittent epigastric discomfort.  Overall, this is stable/improved after discontinuation of gabapentin.  She is taking Phenergan as needed which is controlling symptoms well.  Suspect symptoms may also be improved if GERD were under better management and if constipation were under better management; however, patient has declined adjusting PPI therapy or chronic constipation medications.  See discussion above and below for additional details. She will continue her current medications.  Notably, due to history of IDA, patient is being scheduled for TCS/EGD. See details below.

## 2020-02-15 NOTE — Assessment & Plan Note (Addendum)
Intermittent vague upper abdominal pain/bloating, sometimes related to meals.  Also with GERD not adequately controlled on omeprazole 40 mg twice daily and chronic constipation that is not adequately controlled on Linzess 290 mcg and Symproic daily.  Admits to taking meloxicam routinely for arthritis.  Denies melena.   She does have history of profound iron deficiency anemia suspected to be secondary to heavy menses.  Previously offered colonoscopy and possible upper endoscopy but declined. Celiac serologies negative, GES negative in 2012. She is currently ready to schedule procedures.  Suspect epigastric pain may be secondary to gastritis, esophagitis, duodenitis in the setting of chronic NSAID use.  Cannot rule out H. pylori or PUD.  Chronic constipation may also be contributing to epigastric pain.  I offered changing omeprazole to Protonix as well as stopping Symproic and trying Movantik, but patient declined.   Plan: Continue omeprazole 40 mg twice daily per patient's request. Continue Linzess 290 mcg daily and Symproic daily. Requested patient let me know if she would like to try Protonix or Movantik. Proceed with EGD with propofol with Dr. Jena Gauss in the near future.The risks, benefits, and alternatives have been discussed with the patient in detail. The patient states understanding and desires to proceed.  ASA II Recommended she limit meloxicam as much as possible and avoid all other NSAIDs. Follow-up after procedure with Tana Coast, PA

## 2020-02-15 NOTE — Assessment & Plan Note (Addendum)
Patient reports chronic intermittent low-volume rectal bleeding that has been present for years with last episode about 4 months ago.  This is in the setting of chronic constipation. No rectal pain. No melena.  Last colonoscopy in 2011 with single anal papilla.  She has history of profound iron deficiency anemia suspected to be secondary to heavy menses.  Previously offered colonoscopy and upper endoscopy but she declined.  She presents today due to being ready to schedule procedures. Reports labs in March with PCP with unknown hemoglobin.  Intermittent rectal bleeding may be secondary to benign anorectal trauma/internal hemorrhoids in setting of chronic constipation that is not adequately controlled.  Cannot rule out colon polyps or malignancy.  Plan: Request last labs completed with PCP. Requested patient update labs at this time.  Patient declined and prefers to have labs completed at preop for procedures. Proceed with colonoscopy with propofol with Dr. Jena Gauss in the near future. The risks, benefits, and alternatives have been discussed with the patient in detail. The patient states understanding and desires to proceed.  Planning for extended bowel prep in setting of constipation not adequately controlled.  Soft diet for breakfast 2 days prior to colonoscopy, start clear liquids at lunch 2 days prior to colonoscopy, and complete 1.5 bowel preps.  See separate instructions for details. Chronic constipation addressed below. Follow-up after procedure with Tana Coast, PA

## 2020-02-15 NOTE — Assessment & Plan Note (Addendum)
History of chronic IDA with hemoglobin as low as 5.6 in March 2020 requiring blood transfusion. She is not currently on oral iron. IDA has been suspected to be secondary to heavy menses.  I FOBT negative in 2018.  Celiac serologies negative. Last colonoscopy in 2011 with single anal papilla.  She has repeatedly declined colonoscopy and possible upper endoscopy on multiple occasions.   Patient presents today stating she is now ready to proceed with procedures.  Reports chronic history of intermittent bright red blood per rectum with last occurrence about 4 months ago.  This is in the setting of chronic constipation as per above.  Denies melena.  She does have history of GERD that is not adequately controlled on omeprazole 40 mg twice daily and intermittent epigastric abdominal pain/bloating, occasionally affected by meals.  She is taking meloxicam on a regular basis for arthritis.  Reports having labs completed with PCP in March 2021 with unknown hemoglobin.  Patient needs upper endoscopy and colonoscopy to evaluate for iron deficiency anemia.  Differentials include gastritis, duodenitis, esophagitis, H. pylori, PUD, AVMs, colon polyps, and cannot rule out malignancy.  Of note, I offered to change PPI and adjust chronic constipation medications; however, patient has declined.  Plan: Request last labs from PCP. I requested patient have repeat labs today.  She declined and requested having labs completed at preop for procedures. Proceed with TCS/EGD with propofol with Dr. Jena Gauss in the near future. The risks, benefits, and alternatives have been discussed with the patient in detail. The patient states understanding and desires to proceed.  ASA II Plan for extended bowel prep due to chronic constipation not adequately managed.  She will have a soft diet for breakfast 2 days prior to colonoscopy and start clear liquids at lunch 2 days prior.  She will complete 1.5 bowel preps. Recommended she limit meloxicam use  as much as possible and avoid all other NSAIDs. GERD addressed above. Chronic constipation addressed above. Follow-up after procedures with Tana Coast, PA

## 2020-02-15 NOTE — Telephone Encounter (Signed)
Noted. She should know if her hemoglobin is too low and she needs a blood transfusion, her procedure may end up being cancelled depending on how soon we can get that arranged. Ok to hold off on oral iron until after procedures.

## 2020-02-15 NOTE — Assessment & Plan Note (Addendum)
Chronic constipation thought to be secondary to chronic opioid use.  Constipation has been difficult to manage.  Currently on Symproic daily, Linzess 290 mcg daily, stool softeners daily, and still requiring "laxatives" every 3 days.  Intermittent rectal bleeding with last occurrence 4 months ago.  Last colonoscopy in 2011 with single anal papilla.  She has history of chronic IDA requiring blood transfusions in the past and has repeatedly declined colonoscopy.  However, she is agreeable to colonoscopy today.  Discussed changing Symproic to Movantik, but patient does not want to change any of her constipation medications at this time.  Plan: Continue Symproic and Linzess 290 mcg daily at patient's request. Continue stool softener daily. Add MiraLAX 1 capful (17 g) daily in 8 ounces of water. Advised patient to let me know if she would like to try Movantik. Proceed with colonoscopy with propofol with Dr. Jena Gauss in the near future. The risks, benefits, and alternatives have been discussed with the patient in detail. The patient states understanding and desires to proceed.  ASA II Plan for extended bowel prep due to constipation not adequately managed.  2 days prior, she will have a soft diet for breakfast followed by clear liquid starting at lunch.  She will also complete 1.5 bowel preps. Follow-up after colonoscopy with Tana Coast, PA

## 2020-02-15 NOTE — Assessment & Plan Note (Addendum)
Chronic history of GERD currently on omeprazole 40 mg twice daily with breakthrough symptoms daily.  Also taking meloxicam routinely for arthritis.  Reports being on Dexilant, Nexium, Prevacid previously.  Does not remember being on Protonix.  Offered changing PPI to Protonix 40 mg twice daily, but patient declined.  She will continue on omeprazole 40 mg twice daily per her request. Advise she let me know if she would like to try Protonix. Counseled on GERD diet/lifestyle.  Information on this was provided. Advised to limit meloxicam as much as possible and avoid all other NSAIDs. Plan to follow-up after EGD/TCS for anemia as below.

## 2020-02-15 NOTE — Telephone Encounter (Signed)
Spoke with pt. Pt was notified of Ermalinda Memos, PA's recommendations.

## 2020-02-15 NOTE — Telephone Encounter (Signed)
Spoke with pt. Pt was notified that previous lab results were reviewed and due to her Hemoglobin being low, lab work is requesting at this time. Per pt, she refuses to have labs done. Pt states she will still like to wait. Pt is aware that Hemoglobin was low 06/2019. Pt isn't taking an iron supplement.

## 2020-02-16 NOTE — Progress Notes (Signed)
CC'ED TO PCP 

## 2020-03-01 ENCOUNTER — Other Ambulatory Visit: Payer: Self-pay

## 2020-03-01 ENCOUNTER — Other Ambulatory Visit (HOSPITAL_COMMUNITY)
Admission: RE | Admit: 2020-03-01 | Discharge: 2020-03-01 | Disposition: A | Discharge status: Home / Self Care | Payer: Medicare Other | Source: Ambulatory Visit | Attending: Internal Medicine | Admitting: Internal Medicine

## 2020-03-01 DIAGNOSIS — Z20822 Contact with and (suspected) exposure to covid-19: Secondary | ICD-10-CM | POA: Diagnosis not present

## 2020-03-01 DIAGNOSIS — Z01812 Encounter for preprocedural laboratory examination: Secondary | ICD-10-CM | POA: Diagnosis present

## 2020-03-02 ENCOUNTER — Other Ambulatory Visit (HOSPITAL_COMMUNITY): Payer: Medicare Other

## 2020-03-02 LAB — SARS CORONAVIRUS 2 (TAT 6-24 HRS): SARS Coronavirus 2: NEGATIVE

## 2020-03-03 ENCOUNTER — Encounter (HOSPITAL_COMMUNITY): Payer: Self-pay | Admitting: Internal Medicine

## 2020-03-03 ENCOUNTER — Other Ambulatory Visit: Payer: Self-pay

## 2020-03-03 ENCOUNTER — Ambulatory Visit (HOSPITAL_COMMUNITY): Payer: Medicare Other | Admitting: Anesthesiology

## 2020-03-03 ENCOUNTER — Encounter (HOSPITAL_COMMUNITY)
Admission: RE | Disposition: A | Discharge status: Home / Self Care | Payer: Self-pay | Source: Home / Self Care | Attending: Internal Medicine

## 2020-03-03 ENCOUNTER — Ambulatory Visit (HOSPITAL_COMMUNITY)
Admission: RE | Admit: 2020-03-03 | Discharge: 2020-03-03 | Disposition: A | Discharge status: Home / Self Care | Payer: Medicare Other | Attending: Internal Medicine | Admitting: Internal Medicine

## 2020-03-03 DIAGNOSIS — G4733 Obstructive sleep apnea (adult) (pediatric): Secondary | ICD-10-CM | POA: Diagnosis not present

## 2020-03-03 DIAGNOSIS — E669 Obesity, unspecified: Secondary | ICD-10-CM | POA: Diagnosis not present

## 2020-03-03 DIAGNOSIS — Z888 Allergy status to other drugs, medicaments and biological substances status: Secondary | ICD-10-CM | POA: Diagnosis not present

## 2020-03-03 DIAGNOSIS — L409 Psoriasis, unspecified: Secondary | ICD-10-CM | POA: Diagnosis not present

## 2020-03-03 DIAGNOSIS — Z6839 Body mass index (BMI) 39.0-39.9, adult: Secondary | ICD-10-CM | POA: Diagnosis not present

## 2020-03-03 DIAGNOSIS — Z833 Family history of diabetes mellitus: Secondary | ICD-10-CM | POA: Insufficient documentation

## 2020-03-03 DIAGNOSIS — F1120 Opioid dependence, uncomplicated: Secondary | ICD-10-CM | POA: Diagnosis not present

## 2020-03-03 DIAGNOSIS — K6389 Other specified diseases of intestine: Secondary | ICD-10-CM | POA: Insufficient documentation

## 2020-03-03 DIAGNOSIS — Z7952 Long term (current) use of systemic steroids: Secondary | ICD-10-CM | POA: Diagnosis not present

## 2020-03-03 DIAGNOSIS — Z803 Family history of malignant neoplasm of breast: Secondary | ICD-10-CM | POA: Insufficient documentation

## 2020-03-03 DIAGNOSIS — F22 Delusional disorders: Secondary | ICD-10-CM | POA: Diagnosis not present

## 2020-03-03 DIAGNOSIS — Z836 Family history of other diseases of the respiratory system: Secondary | ICD-10-CM | POA: Diagnosis not present

## 2020-03-03 DIAGNOSIS — R1013 Epigastric pain: Secondary | ICD-10-CM | POA: Diagnosis not present

## 2020-03-03 DIAGNOSIS — E039 Hypothyroidism, unspecified: Secondary | ICD-10-CM | POA: Diagnosis not present

## 2020-03-03 DIAGNOSIS — J45909 Unspecified asthma, uncomplicated: Secondary | ICD-10-CM | POA: Insufficient documentation

## 2020-03-03 DIAGNOSIS — K589 Irritable bowel syndrome without diarrhea: Secondary | ICD-10-CM | POA: Insufficient documentation

## 2020-03-03 DIAGNOSIS — Z791 Long term (current) use of non-steroidal anti-inflammatories (NSAID): Secondary | ICD-10-CM | POA: Insufficient documentation

## 2020-03-03 DIAGNOSIS — M797 Fibromyalgia: Secondary | ICD-10-CM | POA: Diagnosis not present

## 2020-03-03 DIAGNOSIS — Z7982 Long term (current) use of aspirin: Secondary | ICD-10-CM | POA: Diagnosis not present

## 2020-03-03 DIAGNOSIS — Z79899 Other long term (current) drug therapy: Secondary | ICD-10-CM | POA: Diagnosis not present

## 2020-03-03 DIAGNOSIS — K219 Gastro-esophageal reflux disease without esophagitis: Secondary | ICD-10-CM | POA: Diagnosis not present

## 2020-03-03 DIAGNOSIS — Q438 Other specified congenital malformations of intestine: Secondary | ICD-10-CM

## 2020-03-03 DIAGNOSIS — D509 Iron deficiency anemia, unspecified: Secondary | ICD-10-CM | POA: Diagnosis present

## 2020-03-03 HISTORY — PX: ESOPHAGOGASTRODUODENOSCOPY (EGD) WITH PROPOFOL: SHX5813

## 2020-03-03 HISTORY — PX: COLONOSCOPY WITH PROPOFOL: SHX5780

## 2020-03-03 LAB — PREGNANCY, URINE: Preg Test, Ur: NEGATIVE

## 2020-03-03 SURGERY — COLONOSCOPY WITH PROPOFOL
Anesthesia: General

## 2020-03-03 MED ORDER — LACTATED RINGERS IV SOLN: INTRAVENOUS | Status: DC | PRN

## 2020-03-03 MED ORDER — LIDOCAINE VISCOUS HCL 2 % MT SOLN
OROMUCOSAL | Status: AC
  Filled 2020-03-03: qty 15

## 2020-03-03 MED ORDER — STERILE WATER FOR IRRIGATION IR SOLN
Status: DC | PRN
  Administered 2020-03-03: 200 mL

## 2020-03-03 MED ORDER — LIDOCAINE VISCOUS HCL 2 % MT SOLN
15.0000 mL | Freq: Once | OROMUCOSAL | Status: AC
  Administered 2020-03-03: 15 mL via OROMUCOSAL

## 2020-03-03 MED ORDER — CHLORHEXIDINE GLUCONATE CLOTH 2 % EX PADS: 6.0000 | MEDICATED_PAD | Freq: Once | CUTANEOUS | Status: DC

## 2020-03-03 MED ORDER — GLYCOPYRROLATE 0.2 MG/ML IJ SOLN
INTRAMUSCULAR | Status: DC | PRN
  Administered 2020-03-03: .1 mg via INTRAVENOUS

## 2020-03-03 MED ORDER — PROPOFOL 10 MG/ML IV BOLUS
INTRAVENOUS | Status: DC | PRN
  Administered 2020-03-03: 100 mg via INTRAVENOUS
  Administered 2020-03-03: 175 ug/kg/min via INTRAVENOUS

## 2020-03-03 MED ORDER — GLYCOPYRROLATE 0.2 MG/ML IJ SOLN
0.2000 mg | Freq: Once | INTRAMUSCULAR | Status: AC
  Administered 2020-03-03: 0.2 mg via INTRAVENOUS

## 2020-03-03 MED ORDER — GLYCOPYRROLATE 0.2 MG/ML IJ SOLN
INTRAMUSCULAR | Status: AC
  Filled 2020-03-03: qty 1

## 2020-03-03 MED ORDER — LACTATED RINGERS IV SOLN: Freq: Once | INTRAVENOUS | Status: AC

## 2020-03-03 NOTE — Anesthesia Postprocedure Evaluation (Signed)
Anesthesia Post Note  Patient: WAYNE BRUNKER  Procedure(s) Performed: COLONOSCOPY WITH PROPOFOL (N/A ) ESOPHAGOGASTRODUODENOSCOPY (EGD) WITH PROPOFOL (N/A )  Patient location during evaluation: Endoscopy Anesthesia Type: General Level of consciousness: awake, oriented, awake and alert and patient cooperative Pain management: pain level controlled Vital Signs Assessment: post-procedure vital signs reviewed and stable Respiratory status: spontaneous breathing, respiratory function stable and nonlabored ventilation Cardiovascular status: blood pressure returned to baseline and stable Postop Assessment: no headache and no backache Anesthetic complications: no   No complications documented.   Last Vitals:  Vitals:   03/03/20 0923  BP: 124/67  Pulse: (!) 55  Resp: (!) 21  Temp: 37.1 C  SpO2: 98%    Last Pain:  Vitals:   03/03/20 0954  TempSrc:   PainSc: 0-No pain                 Brynda Peon

## 2020-03-03 NOTE — Op Note (Signed)
[facility] Patient Name: Shirley Perry Procedure Date: 03/03/2020 10:00 AM MRN: [id number] Date of Birth: 1975/09/30 Attending MD: Gennette Pac , MD CSN: [id number] Age: 44 Admit Type: Outpatient Procedure:                Upper GI endoscopy Indications:              Epigastric abdominal pain, Iron deficiency anemia Providers:                Gennette Pac, MD, Sheran Fava,                            Pandora Leiter, Technician Referring MD:              Medicines:                Propofol per Anesthesia Complications:            No immediate complications. Estimated Blood Loss:     Estimated blood loss: none. Procedure:                Pre-Anesthesia Assessment:                           - Prior to the procedure, a History and Physical                            was performed, and patient medications and                            allergies were reviewed. The patient's tolerance of                            previous anesthesia was also reviewed. The risks                            and benefits of the procedure and the sedation                            options and risks were discussed with the patient.                            All questions were answered, and informed consent                            was obtained. Prior Anticoagulants: The patient has                            taken no previous anticoagulant or antiplatelet                            agents. ASA Grade Assessment: II - A patient with                            mild systemic disease. After reviewing the risks  and benefits, the patient was deemed in                            satisfactory condition to undergo the procedure.                           After obtaining informed consent, the endoscope was                            passed under direct vision. Throughout the                            procedure, the patient's blood pressure, pulse, and                             oxygen saturations were monitored continuously. The                            Endoscope was introduced through the mouth, and                            advanced to the second part of duodenum. The upper                            GI endoscopy was accomplished without difficulty.                            The patient tolerated the procedure well. Scope In: 10:00:34 AM Scope Out: 10:05:07 AM Total Procedure Duration: 0 hours 4 minutes 33 seconds  Findings:      The examined esophagus was normal.      The entire examined stomach was normal.      The duodenal bulb and second portion of the duodenum were normal. Impression:               - Normal esophagus.                           - Normal stomach.                           - Normal duodenal bulb and second portion of the                            duodenum.                           - No specimens collected. Moderate Sedation:      Moderate (conscious) sedation was personally administered by an       anesthesia professional. The following parameters were monitored: oxygen       saturation, heart rate, blood pressure, respiratory rate, EKG, adequacy       of pulmonary ventilation, and response to care. Recommendation:           - Patient has a contact number available for  emergencies. The signs and symptoms of potential                            delayed complications were discussed with the                            patient. Return to normal activities tomorrow.                            Written discharge instructions were provided to the                            patient.                           - Resume previous diet.                           - Continue present medications. See colonoscopy                            report                           - Procedure Code(s):        --- Professional ---                           [phone], Esophagogastroduodenoscopy, flexible,                             transoral; diagnostic, including collection of                            specimen(s) by brushing or washing, when performed                            (separate procedure) Diagnosis Code(s):        --- Professional ---                           R10.13, Epigastric pain                           D50.9, Iron deficiency anemia, unspecified CPT copyright 2019 [employer]. All rights reserved. The codes documented in this report are preliminary and upon coder review may  be revised to meet current compliance requirements. Gerrit Friends. Rosaleigh Brazzel, MD Gennette Pac, MD 03/03/2020 10:10:02 AM This report has been signed electronically. Number of Addenda: 0

## 2020-03-03 NOTE — Transfer of Care (Signed)
Immediate Anesthesia Transfer of Care Note  Patient: Shirley Perry  Procedure(s) Performed: COLONOSCOPY WITH PROPOFOL (N/A ) ESOPHAGOGASTRODUODENOSCOPY (EGD) WITH PROPOFOL (N/A )  Patient Location: Endoscopy Unit  Anesthesia Type:General  Level of Consciousness: awake, alert , oriented and patient cooperative  Airway & Oxygen Therapy: Patient Spontanous Breathing  Post-op Assessment: Report given to RN, Post -op Vital signs reviewed and stable and Patient moving all extremities  Post vital signs: Reviewed and stable  Last Vitals:  Vitals Value Taken Time  BP    Temp    Pulse    Resp    SpO2      Last Pain:  Vitals:   03/03/20 0954  TempSrc:   PainSc: 0-No pain         Complications: No complications documented.

## 2020-03-03 NOTE — Discharge Instructions (Signed)
Colonoscopy Discharge Instructions  Read the instructions outlined below and refer to this sheet in the next few weeks. These discharge instructions provide you with general information on caring for yourself after you leave the hospital. Your doctor may also give you specific instructions. While your treatment has been planned according to the most current medical practices available, unavoidable complications occasionally occur. If you have any problems or questions after discharge, call Dr. Gala Romney at [phone]. ACTIVITY  You may resume your regular activity, but move at a slower pace for the next 24 hours.   Take frequent rest periods for the next 24 hours.   Walking will help get rid of the air and reduce the bloated feeling in your belly (abdomen).   No driving for 24 hours (because of the medicine (anesthesia) used during the test).    Do not sign any important legal documents or operate any machinery for 24 hours (because of the anesthesia used during the test).  NUTRITION  Drink plenty of fluids.   You may resume your normal diet as instructed by your doctor.   Begin with a light meal and progress to your normal diet. Heavy or fried foods are harder to digest and may make you feel sick to your stomach (nauseated).   Avoid alcoholic beverages for 24 hours or as instructed.  MEDICATIONS  You may resume your normal medications unless your doctor tells you otherwise.  WHAT YOU CAN EXPECT TODAY  Some feelings of bloating in the abdomen.   Passage of more gas than usual.   Spotting of blood in your stool or on the toilet paper.  IF YOU HAD POLYPS REMOVED DURING THE COLONOSCOPY:  No aspirin products for 7 days or as instructed.   No alcohol for 7 days or as instructed.   Eat a soft diet for the next 24 hours.  FINDING OUT THE RESULTS OF YOUR TEST Not all test results are available during your visit. If your test results are not back during the visit, make an appointment  with your caregiver to find out the results. Do not assume everything is normal if you have not heard from your caregiver or the medical facility. It is important for you to follow up on all of your test results.  SEEK IMMEDIATE MEDICAL ATTENTION IF:  You have more than a spotting of blood in your stool.   Your belly is swollen (abdominal distention).   You are nauseated or vomiting.   You have a temperature over 101.   You have abdominal pain or discomfort that is severe or gets worse throughout the day.  EGD Discharge instructions Please read the instructions outlined below and refer to this sheet in the next few weeks. These discharge instructions provide you with general information on caring for yourself after you leave the hospital. Your doctor may also give you specific instructions. While your treatment has been planned according to the most current medical practices available, unavoidable complications occasionally occur. If you have any problems or questions after discharge, please call your doctor. ACTIVITY  You may resume your regular activity but move at a slower pace for the next 24 hours.   Take frequent rest periods for the next 24 hours.   Walking will help expel (get rid of) the air and reduce the bloated feeling in your abdomen.   No driving for 24 hours (because of the anesthesia (medicine) used during the test).   You may shower.   Do not sign any important  legal documents or operate any machinery for 24 hours (because of the anesthesia used during the test).  NUTRITION  Drink plenty of fluids.   You may resume your normal diet.   Begin with a light meal and progress to your normal diet.   Avoid alcoholic beverages for 24 hours or as instructed by your caregiver.  MEDICATIONS  You may resume your normal medications unless your caregiver tells you otherwise.  WHAT YOU CAN EXPECT TODAY  You may experience abdominal discomfort such as a feeling of fullness  or "gas" pains.  FOLLOW-UP  Your doctor will discuss the results of your test with you.  SEEK IMMEDIATE MEDICAL ATTENTION IF ANY OF THE FOLLOWING OCCUR:  Excessive nausea (feeling sick to your stomach) and/or vomiting.   Severe abdominal pain and distention (swelling).   Trouble swallowing.   Temperature over 101 F (37.8 C).   Rectal bleeding or vomiting of blood.    Your EGD was normal.  Your colonoscopy was normal.  No explanation for iron poor blood or abdominal pain found on today's examination.  Recommend you have a repeat colonoscopy in 10 years for colon cancer screening  Office visit with Ermalinda Memos in 4 weeks

## 2020-03-03 NOTE — Interval H&P Note (Signed)
History and Physical Interval Note:  03/03/2020 9:55 AM  Shirley Perry  has presented today for surgery, with the diagnosis of IDA, gerd, nausea, vomiting, abd pain, constipation.  The various methods of treatment have been discussed with the patient and family. After consideration of risks, benefits and other options for treatment, the patient has consented to  Procedure(s) with comments: COLONOSCOPY WITH PROPOFOL (N/A) - 10:15am ESOPHAGOGASTRODUODENOSCOPY (EGD) WITH PROPOFOL (N/A) as a surgical intervention.  The patient's history has been reviewed, patient examined, no change in status, stable for surgery.  I have reviewed the patient's chart and labs.  Questions were answered to the patient's satisfaction.     [name]  No change.  Still with epigastric pain denies dysphagia profound iron deficiency anemia.  EGD and colonoscopy today to further evaluate.  The risks, benefits, limitations, imponderables and alternatives regarding both EGD and colonoscopy have been reviewed with the patient. Questions have been answered. All parties agreeable.

## 2020-03-03 NOTE — Anesthesia Preprocedure Evaluation (Signed)
Anesthesia Evaluation  Patient identified by MRN, date of birth, ID band Patient awake    Reviewed: Allergy & Precautions, NPO status , Patient's Chart, lab work & pertinent test results  History of Anesthesia Complications Negative for: history of anesthetic complications  Airway Mallampati: III  TM Distance: >3 FB Neck ROM: Full    Dental  (+) Chipped, Missing, Poor Dentition, Dental Advisory Given   Pulmonary asthma , sleep apnea and Continuous Positive Airway Pressure Ventilation ,    Pulmonary exam normal breath sounds clear to auscultation       Cardiovascular Exercise Tolerance: Good Normal cardiovascular exam Rhythm:Regular Rate:Normal     Neuro/Psych PSYCHIATRIC DISORDERS Anxiety Depression  Neuromuscular disease    GI/Hepatic   Endo/Other  Hypothyroidism   Renal/GU      Musculoskeletal  (+) Arthritis  (neck pain, herniated discs in neck, numbness in her upper extremities), Fibromyalgia -, narcotic dependent  Abdominal   Peds  Hematology  (+) anemia ,   Anesthesia Other Findings   Reproductive/Obstetrics                             Anesthesia Physical Anesthesia Plan  ASA: III  Anesthesia Plan: General   Post-op Pain Management:    Induction: Intravenous  PONV Risk Score and Plan: TIVA  Airway Management Planned: Nasal Cannula, Natural Airway and Simple Face Mask  Additional Equipment:   Intra-op Plan:   Post-operative Plan:   Informed Consent: I have reviewed the patients History and Physical, chart, labs and discussed the procedure including the risks, benefits and alternatives for the proposed anesthesia with the patient or authorized representative who has indicated his/her understanding and acceptance.     Dental advisory given  Plan Discussed with: CRNA and Surgeon  Anesthesia Plan Comments:         Anesthesia Quick Evaluation

## 2020-03-03 NOTE — Op Note (Signed)
[facility] Patient Name: Shirley Perry Procedure Date: 03/03/2020 10:09 AM MRN: [id number] Date of Birth: [birth year] Attending MD: Gennette Pac , MD CSN: [id number] Age: 44 Admit Type: Outpatient Procedure:                Colonoscopy Indications:              Iron deficiency anemia Providers:                Gennette Pac, MD, Sheran Fava,                            Pandora Leiter, Technician Referring MD:              Medicines:                Propofol per Anesthesia Complications:            No immediate complications. Estimated Blood Loss:     Estimated blood loss: none. Procedure:                Pre-Anesthesia Assessment:                           - Prior to the procedure, a History and Physical                            was performed, and patient medications and                            allergies were reviewed. The patient's tolerance of                            previous anesthesia was also reviewed. The risks                            and benefits of the procedure and the sedation                            options and risks were discussed with the patient.                            All questions were answered, and informed consent                            was obtained. Prior Anticoagulants: The patient has                            taken no previous anticoagulant or antiplatelet                            agents. ASA Grade Assessment: II - A patient with                            mild systemic disease. After reviewing the risks  and benefits, the patient was deemed in                            satisfactory condition to undergo the procedure.                           After obtaining informed consent, the colonoscope                            was passed under direct vision. Throughout the                            procedure, the patient's blood pressure, pulse, and                            oxygen saturations  were monitored continuously. The                            CF-[id number] ([id number]) scope was introduced through                            the anus and advanced to the 5 cm into the ileum.                            The colonoscopy was performed without difficulty.                            The patient tolerated the procedure well. The                            quality of the bowel preparation was adequate. Scope In: 10:12:02 AM Scope Out: 10:39:35 AM Scope Withdrawal Time: 0 hours 9 minutes 17 seconds  Total Procedure Duration: 0 hours 27 minutes 33 seconds  Findings:      The perianal and digital rectal examinations were normal.      Redundant and elongated colon - requiring external abdominal pressure to       reach the cecum. Pooling of stool in a couple of segments required quite       a bit of suctioning to gain adequate visualization. Colonic mucosa       appeared normal. Patient had prominent anal papilla. Distal 5 cm of       terminal ileum appeared normal. Impression:               - Redundant colon. Normal-appearing terminal ileum                           - No specimens collected. Moderate Sedation:      Moderate (conscious) sedation was personally administered by an       anesthesia professional. The following parameters were monitored: oxygen       saturation, heart rate, blood pressure, respiratory rate, EKG, adequacy       of pulmonary ventilation, and response to care. Recommendation:           - Patient has a contact number available for  emergencies. The signs and symptoms of potential                            delayed complications were discussed with the                            patient. Return to normal activities tomorrow.                            Written discharge instructions were provided to the                            patient.                           - Advance diet as tolerated. Repeat colonoscopy 10                             years for cancer screening. See EGD report. Office                            visit with 4 weeks Procedure Code(s):        --- Professional ---                           [phone], Colonoscopy, flexible; diagnostic, including                            collection of specimen(s) by brushing or washing,                            when performed (separate procedure) Diagnosis Code(s):        --- Professional ---                           D50.9, Iron deficiency anemia, unspecified                           Q43.8, Other specified congenital malformations of                            intestine CPT copyright 2019 American Medical Association. All rights reserved. The codes documented in this report are preliminary and upon coder review may  be revised to meet current compliance requirements. Gerrit Friends. Negar Sieler, MD Gennette Pac, MD 03/03/2020 10:47:44 AM This report has been signed electronically. Number of Addenda: 0

## 2020-03-03 NOTE — OR Nursing (Signed)
Patient helped position herself for procedure on left side. Patient stated she is comfortable in side lying position prior to procedure starting.

## 2020-03-09 ENCOUNTER — Encounter (HOSPITAL_COMMUNITY): Payer: Self-pay | Admitting: Internal Medicine

## 2020-03-22 ENCOUNTER — Telehealth: Payer: Self-pay | Admitting: Internal Medicine

## 2020-03-22 NOTE — Telephone Encounter (Signed)
PATIENT CALLED AND SAID THAT HER INSURANCE DENIED HER TCS AND ENDO  [phone] WANTS TO KNOW IF IT CAN BE APPEALED

## 2020-03-22 NOTE — Telephone Encounter (Signed)
Routing to [facility] Clinical pool

## 2020-03-23 NOTE — Telephone Encounter (Signed)
I tired to call the patient to let her know she would need to call the billing department listed on the statement.  I was unable to leave a message.

## 2020-03-23 NOTE — Telephone Encounter (Signed)
Patient insurance does not require PA for procedures. She has [employer] and medicaid. Fowarding to [place] to see if she can further assist.

## 2020-04-11 ENCOUNTER — Telehealth: Payer: Self-pay | Admitting: Internal Medicine

## 2020-04-11 NOTE — Telephone Encounter (Signed)
Pt had a procedure on Mar 03, 2020 with Dr Jena Gauss. She is saying her insurance wasn't going to cover it. I asked her if she's called the billing office and she said no. She is asking for [place] to appeal this to her insurance company. Please advise. [phone]

## 2020-04-11 NOTE — Telephone Encounter (Signed)
I spoke with the patient and she stated the letter from her insurance company states "coverage not covered for [id number]".  I will reach out to our billing department and have them f/u regarding this concern.

## 2020-04-12 NOTE — Telephone Encounter (Signed)
I spoke with the patient and she is going to call the billing department at [phone].

## 2020-04-12 NOTE — Telephone Encounter (Signed)
Patient stated she was told by the billing department it was "pending with insurance".

## 2020-04-20 ENCOUNTER — Ambulatory Visit: Payer: Medicare Other | Admitting: Gastroenterology

## 2020-04-25 ENCOUNTER — Telehealth: Payer: Self-pay | Admitting: Internal Medicine

## 2020-04-25 NOTE — Telephone Encounter (Signed)
moPt called asking how to get her medical records and I told her that I need a signed release of records and she could either come to the office to sign or I could mail or faxed it to her, but I couldn't release anything until I had the release unless her doctor needed them and they could request records. She then asked for the office manager. I told her that Ave Filter was our office manager and she was on another call and did she want me to transfer to VM. She said, No and hung up.

## 2020-04-25 NOTE — Telephone Encounter (Signed)
Patient wanted records faxed to her insurance company to process an appeal for denial of her services.  Procedure Note and Office Visit note sent to [phone] Attention: Appeals Department.  Routing to [employer]

## 2020-04-25 NOTE — Telephone Encounter (Signed)
Records were faxed and received confirmation.

## 2020-05-29 ENCOUNTER — Other Ambulatory Visit: Payer: Self-pay | Admitting: Gastroenterology

## 2020-06-14 NOTE — Progress Notes (Signed)
Referring Provider: Ignatius SpeckingVyas, Dhruv B, MD Primary Care Physician:  Ignatius SpeckingVyas, Dhruv B, MD Primary GI Physician: Dr. Jena Gaussourk  Chief Complaint  Patient presents with   Gastroesophageal Reflux    HPI:   Shirley Perry is a 45 y.o. female presenting today with a history of nausea in the setting of chronic opioid use, opioid-induced constipation, GERD, chronic abdominal pain, profound anemia in setting of heavy menses.  Celiac serologies previously negative.  GES negative in 2012.  Last seen in our office 02/08/2020 for GERD, epigastric pain, nausea with vomiting, constipation, rectal bleeding, and iron deficiency anemia.  Continued to have GERD symptoms daily on omeprazole twice daily.  Reported being on Nexium, Prevacid, Dexilant in the past and did not want to change PPI.  Intermittent upper abdominal pain/bloating that was chronic and occasionally affected by meals.  Taking meloxicam on a routine basis.  Also a generalized abdominal pain that was chronic, intermittent, occurred randomly, maybe every few weeks and was worse if no BM.  In general, this was improved since stopping gabapentin.  Noted nausea/interim vomiting had also improved since stopping gabapentin.  Constipation still not adequately managed on Linzess 290 mcg daily, Symproic daily, stool softener daily, and laxatives every 3 days.  Reporting having a couple episodes of low-volume rectal bleeding, last occurring 4 months ago.  This was chronic and intermittent for years.  Denied rectal pain or melena. She continues with heavy menstrual cycles.  Reported having labs in March 2021.  Plan is to request prior labs, proceed with colonoscopy and upper endoscopy.  Discussed changing PPI in chronic constipation medications, but patient declined.  Recommended adding MiraLAX daily.  Labs in March 2021 with hemoglobin 7.7, ferritin 2, iron 11, saturation 2%.  I recommended completing labs prior to colonoscopy, but patient declined.  Appears CBC and  iron panel were never updated.  Procedures 03/03/2020: Colonoscopy: Redundant colon, normal-appearing TI and colonic mucosa, prominent anal papilla.  Repeat colonoscopy in 10 years. EGD: Normal examined esophagus, stomach, and duodenum.  Today:   Constipation: Taking Linzess 290 mcg daily, Symproic daily, Colace 200 mg daily then every third day she takes 5, 5mg  bisacodyl. BMs every 2-3 days. Stools are soft to hard. Depends. No blood in the stools or black stools. Doesn't want to change her medications. Discussed several options with her today, but she medication change.  IDA: No overt GI bleeding. Continues to have heavy menstrual cycles.no other obvious blood loss. No labs since March 2021 aside from thyroid testing. Doesn't want to have labs. States she has an appointment with her primary care next month and will have labs at that time. Refuses eye follow-up. Discussed possibility of Givens capsule to complete GI work-up, but she declined. She is not taking oral iron. We discussed the possibility of iron infusions, but she also declined this.  Weight Loss: Patient has lost 38 pounds in the last 4 months. Patient states this is secondary to nausea. Also states she has been intentionally limiting her portion sizes. She has not done anything else in efforts to lose such significant weight. Reports nausea is daily with vomiting a couple times a week. Reports eating 2-3 meals a day. Asked for 24-hour recall: Eggs, broccoli, popcorn yesterday. States she is a vegetarian. Does not do any sort of protein supplementation. Reports nausea has been worsening over the last few months. She will wake with nausea. No associated abdominal pain. Taking promethazine as needed which does help. Reports Zofran did not help previously.  Continues with GERD symptoms daily on omeprazole twice daily. Discussed changing medication as I suspect this is contributing to her ongoing nausea, but she declines. Asked several times  throughout our encounter, but she repeatedly declines medication changes.  After discussing several possible medication changes, testing to evaluate iron deficiency anemia as well as weight loss, patient states she does not want any medication changes and she does not want any test, she only came to this appointment to get her colonoscopy and EGD results.  Past Medical History:  Diagnosis Date   Allergic rhinitis    Anemia    Asthma    Chronic constipation    Depression    GERD (gastroesophageal reflux disease)    Hiatal hernia    Hypothyroidism    Interstitial cystitis    Irritable bowel syndrome    Obesity    OSA on CPAP    Overactive bladder    Paranoia (HCC)    Psoriasis     Past Surgical History:  Procedure Laterality Date   BLADDER REPAIR     CHOLECYSTECTOMY  1999   COLONOSCOPY  2011   single anal papilla   COLONOSCOPY WITH PROPOFOL N/A 03/03/2020    Surgeon: Corbin Ade, MD;  Redundant colon, normal-appearing TI and colonic mucosa, prominent anal papilla.  Repeat colonoscopy in 10 years.   ESOPHAGOGASTRODUODENOSCOPY  2006   patulous EGJ, small hh   ESOPHAGOGASTRODUODENOSCOPY  11/10/2010   DIY:MEBRAX esophagus/patulous EG junction/small HH   ESOPHAGOGASTRODUODENOSCOPY (EGD) WITH PROPOFOL N/A 03/03/2020   Surgeon: Corbin Ade, MD;  Normal examined esophagus, stomach, and duodenum.   TUBAL LIGATION Bilateral     Current Outpatient Medications  Medication Sig Dispense Refill   acetaminophen (TYLENOL) 500 MG tablet Take 1,000 mg by mouth every 6 (six) hours as needed for mild pain or moderate pain.      aspirin EC 81 MG tablet Take 81 mg by mouth at bedtime.      baclofen (LIORESAL) 10 MG tablet Take 10 mg by mouth 2 (two) times a day. Additional 10 mg if needed up the three time a day for muscle spasm     diazepam (VALIUM) 5 MG tablet Take 5 mg by mouth 2 (two) times daily. Additional 5 mg  if needed  2   docusate sodium (COLACE) 100  MG capsule Take 200 mg by mouth daily. 200 every day, then 100mg  every 3rd day     docusate sodium (COLACE) 250 MG capsule Take 250 mg by mouth daily.     Fiber POWD Take 10 mLs by mouth 4 (four) times daily.     LATUDA 80 MG TABS tablet Take 80 mg by mouth at bedtime.   1   levothyroxine (SYNTHROID, LEVOTHROID) 100 MCG tablet Take 1 tablet (100 mcg total) by mouth daily before breakfast. (Patient taking differently: Take 50 mcg by mouth daily before breakfast.) 90 tablet 3   linaclotide (LINZESS) 290 MCG CAPS capsule Take 1 capsule (290 mcg total) by mouth daily before breakfast. 30 capsule 11   meloxicam (MOBIC) 15 MG tablet Take 15 mg by mouth at bedtime.      methylPREDNISolone (MEDROL DOSEPAK) 4 MG TBPK tablet Take 4 mg by mouth See admin instructions. Once a month as needed     Naldemedine Tosylate (SYMPROIC) 0.2 MG TABS Take 0.2 mg by mouth daily.      omeprazole (PRILOSEC) 40 MG capsule Take 1 capsule (40 mg total) by mouth 2 (two) times daily. 60 capsule 11  pentosan polysulfate (ELMIRON) 100 MG capsule Take 100 mg by mouth 3 (three) times daily before meals.     pregabalin (LYRICA) 200 MG capsule Take 200 mg by mouth 3 (three) times daily.     promethazine (PHENERGAN) 25 MG suppository INSERT ONE SUPPOSITORY RECTALLY EVERY 6 HOURS AS NEEDED FOR NAUSEA AND VOMITING (Patient taking differently: Place 25 mg rectally every 6 (six) hours as needed for nausea or vomiting.) 30 suppository 3   promethazine (PHENERGAN) 25 MG tablet TAKE 1 TABLET BY MOUTH EVERY 8 HOURS AS NEEDED FOR NAUSEA AND VOMITING (Patient taking differently: Take 25 mg by mouth every 6 (six) hours as needed for nausea or vomiting.) 20 tablet 2   sertraline (ZOLOFT) 50 MG tablet Take 50 mg by mouth at bedtime.     traMADol (ULTRAM) 50 MG tablet Take 50-100 mg by mouth every 8 (eight) hours as needed for moderate pain or severe pain.      VENTOLIN HFA 108 (90 Base) MCG/ACT inhaler INHALE TWO PUFFS EVERY 6 HOURS  AS NEEDED WHEEZING SHORTNESS OF BREATH (Patient taking differently: Inhale 2 puffs into the lungs every 6 (six) hours as needed for wheezing or shortness of breath.) 18 g 2   hydrocortisone (ANUSOL-HC) 25 MG suppository INSERT ONE SUPPOSITORY RECTALLY TWICE DAILY (Patient not taking: Reported on 06/15/2020) 20 suppository 0   nystatin cream (MYCOSTATIN) APPLY TO AFFECTED AREA(S) TWICE DAILY (Patient not taking: Reported on 06/15/2020) 30 g 0   No current facility-administered medications for this visit.    Allergies as of 06/15/2020 - Review Complete 06/15/2020  Allergen Reaction Noted   Amitiza [lubiprostone]  02/08/2020   Other Other (See Comments) 09/11/2019    Family History  Problem Relation Age of Onset   Ulcers Mother    Breast cancer Mother    Diabetes Father    Sleep apnea Sister    Anesthesia problems Neg Hx    Hypotension Neg Hx    Malignant hyperthermia Neg Hx    Pseudochol deficiency Neg Hx    Colon cancer Neg Hx     Social History   Socioeconomic History   Marital status: Divorced    Spouse name: Not on file   Number of children: 2   Years of education: College   Highest education level: Not on file  Occupational History   Occupation: unemployed    Associate Professor: UNEMPLOYED    Employer: NOT EMPLOYED  Tobacco Use   Smoking status: Never Smoker   Smokeless tobacco: Never Used  Substance and Sexual Activity   Alcohol use: No   Drug use: No   Sexual activity: Never  Other Topics Concern   Not on file  Social History Narrative   Patient lives at home with her daughter.   Caffeine Use: some   Social Determinants of Corporate investment banker Strain: Not on file  Food Insecurity: Not on file  Transportation Needs: Not on file  Physical Activity: Not on file  Stress: Not on file  Social Connections: Not on file    Review of Systems: Gen: Admits to fatigue and chronic intermittent lightheadedness. Denies fever, chills, cold or  flulike symptoms, or syncope. CV: Denies chest pain or heart palpitations. Resp: Denies dyspnea or cough. GI: See HPI Heme: See HPI  Physical Exam: BP 109/65    Pulse (!) 112    Temp (!) 96.9 F (36.1 C) (Temporal)    Ht 5\' 5"  (1.651 m)    Wt 198 lb 12.8 oz (90.2 kg)  LMP 06/08/2020 (Approximate)    BMI 33.08 kg/m  General:   Alert and oriented. No distress noted. Appears pale and tired. Head:  Normocephalic and atraumatic. Eyes:  Conjuctiva clear without scleral icterus. Heart:  S1, S2 present without murmurs appreciated. Lungs:  Clear to auscultation bilaterally. No wheezes, rales, or rhonchi. No distress.  Abdomen:  +BS, soft, non-tender and non-distended. No rebound or guarding. No HSM or masses noted. Msk:  Symmetrical without gross deformities. Normal posture. Extremities:  Without edema. Neurologic:  Alert and  oriented x4 Psych: Normal mood. Flat affect.  Assessment/Plan: 45 year old female with GI history significant for chronic nausea in the setting of chronic opioid use, opioid-induced constipation, GERD, chronic abdominal pain, as well as history of profound anemia thought to be secondary to heavy menses. Celiac serologies previously negative, GES negative in 2012. She is presenting today for follow-up s/p EGD and colonoscopy.  Constipation: Has been difficult to manage. Currently on several medications including Linzess 290 mcg, Symproic, Colace 200 mg, and 25 mg of bisacodyl every 3 days. Currently with BMs every 2 to 3 days with stool still ranging from soft to hard at times. No alarm symptoms. Colonoscopy 03/03/2020 with normal-appearing colonic mucosa and TI, due for repeat in 10 years. Discussed possible changes in medication to gain better control of constipation with fewer medications, but patient declined. She will continue her current regimen.   GERD: Uncontrolled on omeprazole 40 mg twice daily. EGD 03/03/2020 with entirely normal exam. Discussed medication changes,  but patient declined. She will continue her current medications.  IDA: Chronic. Thought to be secondary to heavy menses. Recent colonoscopy and EGD 03/03/2020 with essentially normal exams. No GI explanation of IDA. Discussed possible Givens capsule to complete GI evaluation or at least completing I FOBT to rule out occult GI bleed, but patient declined. Also requested to update lab work as last labs were in March 2021 with hemoglobin of 7.7, ferritin 2, iron 11, saturation 2%, but patient declined. Explained that I was concerned about her anemia as she is fatigued and appears pale. Patient repeatedly refused labs and states she has appointment with PCP next month and will have labs with them.  Weight loss: Documented 38 pound weight loss in the last 4 months, and total of 57 pound weight loss in the last 8 months. Patient reports this is secondary to worsening chronic nausea with intermittent vomiting as well as intentionally limiting portion sizes. Taking Phenergan as needed for nausea which does help. Previously Zofran was unhelpful. Suspect uncontrolled GERD, ongoing constipation requiring multiple laxatives, polypharmacy likely contributing to ongoing nausea. Recent EGD with no significant findings in the upper GI tract. Colonoscopy with no concerning findings. With such significant weight loss, cannot rule out occult malignancy. Discussed medication changes for GERD and constipation as I suspect to gain better control of the symptoms would improve her nausea, but patient declined. Also discussed pursuing CT of her abdomen/pelvis to rule out any other occult process that may be contributing to her nausea and weight loss, but patient also declined this. I went back to this several times throughout our visit, explained my concern and the need for additional testing, but patient states she does not want any test, labs, or medication changes.   Ultimately, patient has refused all options I presented today in  order to evaluate and help improve her current symptoms. We discussed what her goal was for this appointment. States she just wanted to come get her colonoscopy and upper endoscopy results.  She does not want any testing including labs or imaging and does not want any medication changes. I asked if she would like to schedule follow-up in 3 months to possibly discuss ongoing management at that time. Patient declines and states she would like to only follow-up with PCP and see [place] only if she has to. Advised that I would not schedule a follow-up at this time, but I would be more than happy to see her back in the office when she is ready.  Ermalinda Memos, PA-C [facility]

## 2020-06-15 ENCOUNTER — Encounter: Payer: Self-pay | Admitting: Gastroenterology

## 2020-06-15 ENCOUNTER — Ambulatory Visit (INDEPENDENT_AMBULATORY_CARE_PROVIDER_SITE_OTHER): Payer: Medicare Other | Admitting: Gastroenterology

## 2020-06-15 ENCOUNTER — Other Ambulatory Visit: Payer: Self-pay

## 2020-06-15 VITALS — BP 109/65 | HR 112 | Temp 96.9°F | Ht 65.0 in | Wt 198.8 lb

## 2020-06-15 DIAGNOSIS — R634 Abnormal weight loss: Secondary | ICD-10-CM

## 2020-06-15 DIAGNOSIS — K219 Gastro-esophageal reflux disease without esophagitis: Secondary | ICD-10-CM

## 2020-06-15 DIAGNOSIS — D509 Iron deficiency anemia, unspecified: Secondary | ICD-10-CM

## 2020-06-15 DIAGNOSIS — R112 Nausea with vomiting, unspecified: Secondary | ICD-10-CM

## 2020-06-15 DIAGNOSIS — K5903 Drug induced constipation: Secondary | ICD-10-CM

## 2020-06-15 NOTE — Patient Instructions (Signed)
Aswe discussed, your colonoscopy and upper endoscopy were normal.  You are due for repeat colonoscopy in 2031.  Continue taking your current medications as you requested.  You need updated labs due to your iron deficiency anemia and significant weight loss.  I also think you need a CT of your abdomen and pelvis due to significant weight loss.  As requested, we are not pursuing any testing or imaging at this time.  We will hold off on scheduling a follow-up for now as requested to see your PCP about all of your medical conditions for now and only follow-up with [place] if absolutely necessary.  I will be more than happy to see you again in the office!  Please call us back when you are ready to follow-up.  Ermalinda Memos, PA-C [facility]

## 2020-06-16 ENCOUNTER — Encounter: Payer: Self-pay | Admitting: Gastroenterology

## 2020-08-04 ENCOUNTER — Other Ambulatory Visit: Payer: Self-pay | Admitting: Gastroenterology

## 2020-09-06 ENCOUNTER — Other Ambulatory Visit: Payer: Self-pay | Admitting: Gastroenterology

## 2020-09-06 ENCOUNTER — Telehealth: Payer: Self-pay | Admitting: Internal Medicine

## 2020-09-06 DIAGNOSIS — K5903 Drug induced constipation: Secondary | ICD-10-CM

## 2020-09-06 MED ORDER — SYMPROIC 0.2 MG PO TABS: 0.2000 mg | ORAL_TABLET | Freq: Every day | ORAL | 5 refills | Status: DC

## 2020-09-06 NOTE — Telephone Encounter (Signed)
Pt LMOM that she wanted to speak with nurse. [phone]

## 2020-09-06 NOTE — Telephone Encounter (Signed)
Spoke to pt.  She said that she needs refill on symproic 0.2.  She said that [facility] hasn't got a response from [place].

## 2020-09-06 NOTE — Telephone Encounter (Signed)
Rx sent 

## 2020-09-07 NOTE — Telephone Encounter (Signed)
Lmom for pt to call me back. 

## 2020-09-07 NOTE — Telephone Encounter (Signed)
Called pt and made her aware that RX has been sent.  Pt voiced understanding.

## 2020-09-26 ENCOUNTER — Other Ambulatory Visit: Payer: Self-pay | Admitting: Gastroenterology

## 2020-10-10 ENCOUNTER — Encounter: Payer: Self-pay | Admitting: Internal Medicine

## 2020-11-18 ENCOUNTER — Other Ambulatory Visit: Payer: Self-pay | Admitting: Family Medicine

## 2020-11-18 DIAGNOSIS — K648 Other hemorrhoids: Secondary | ICD-10-CM

## 2020-12-15 ENCOUNTER — Other Ambulatory Visit: Payer: Self-pay

## 2020-12-15 ENCOUNTER — Encounter: Payer: Self-pay | Admitting: General Surgery

## 2020-12-15 ENCOUNTER — Ambulatory Visit (INDEPENDENT_AMBULATORY_CARE_PROVIDER_SITE_OTHER): Payer: Medicare Other | Admitting: General Surgery

## 2020-12-15 VITALS — BP 122/77 | HR 53 | Temp 98.7°F | Resp 12 | Ht 65.0 in | Wt 188.0 lb

## 2020-12-15 DIAGNOSIS — K602 Anal fissure, unspecified: Secondary | ICD-10-CM | POA: Diagnosis not present

## 2020-12-15 NOTE — Patient Instructions (Signed)
Anal Fissure, Adult  An anal fissure is a small tear or crack in the tissue of the anus. Bleeding from a fissure usually stops on its own within a few minutes. However, bleedingwill often occur again with each bowel movement until the fissure heals. What are the causes? This condition is usually caused by passing a large or hard stool (feces). Other causes include: Constipation. Frequent diarrhea. Inflammatory bowel disease (Crohn's disease or ulcerative colitis). Childbirth. Infections. Anal sex. What are the signs or symptoms? Symptoms of this condition include: Bleeding from the rectum. Small amounts of blood seen on your stool, on the toilet paper, or in the toilet after a bowel movement. The blood coats the outside of the stool and is not mixed with the stool. Painful bowel movements. Itching or irritation around the anus. How is this diagnosed? A health care provider may diagnose this condition by closely examining the anal area. An anal fissure can usually be seen with careful inspection. In some cases, a rectal exam may be performed, or a short tube (anoscope) may be used to examine the anal canal. How is this treated? Initial treatment for this condition may include: Taking steps to avoid constipation. This may include making changes to your diet, such as increasing your intake of fiber or fluid. Taking fiber supplements. These supplements can soften your stool to help make bowel movements easier. Your health care provider may also prescribe a stool softener if your stool is hard. Taking sitz baths. This may help to heal the tear. Using medicated creams or ointments. These may be prescribed to lessen discomfort. Treatments that are sometimes used if initial treatments do not work well or if the condition is more severe may include: Botulinum injection. Surgery to repair the fissure. Follow these instructions at home: Eating and drinking  Avoid foods that may cause  constipation, such as bananas, milk, and other dairy products. Eat all fruits, except bananas. Drink enough fluid to keep your urine pale yellow. Eat foods that are high in fiber, such as beans, whole grains, and fresh fruits and vegetables.  General instructions  Take over-the-counter and prescription medicines only as told by your health care provider. Use creams or ointments only as told by your health care provider. Keep the anal area clean and dry. Take sitz baths as told by your health care provider. Do not use soap in the sitz baths. Keep all follow-up visits as told by your health care provider. This is important.  Contact a health care provider if you have: More bleeding. A fever. Diarrhea that is mixed with blood. Pain that continues. Ongoing problems that are getting worse rather than better. Summary An anal fissure is a small tear or crack in the tissue of the anus. This condition is usually caused by passing a large or hard stool (feces). Other causes include constipation and frequent diarrhea. Initial treatment for this condition may include taking steps to avoid constipation, such as increasing your intake of fiber or fluid. Follow instructions for care as told by your health care provider. Contact your health care provider if you have more bleeding or your problem is getting worse rather than better. Keep all follow-up visits as told by your health care provider. This is important. This information is not intended to replace advice given to you by your health care provider. Make sure you discuss any questions you have with your healthcare provider. Document Revised: 09/19/2017 Document Reviewed: 09/19/2017 Elsevier Patient Education  2022 Elsevier Inc.  

## 2020-12-15 NOTE — Progress Notes (Signed)
[facility] History and Physical  Reason for Referral: Hemorrhoids?  Referring Physician: Ignatius Specking, MD   Chief Complaint   New Patient (Initial Visit)     Shirley Perry is a 45 y.o. female.  HPI: [name] is a 45 yo who has chronic constipation on Linzess an comes in with severe pain in her anus with defecation. She has had colonoscopy in the past for rectal bleeding with Dr. Jena Perry 02/2020 and no reports of any large hemorrhoids were made.  She says she can have hard stools on occasion and has had some recently. Her pain is worse with hard stools.  She is under a pain contract and takes tramadol chronically.  She reports having pain for over 1 year in her rectal area. She has some minor blood on occasion but this is on the toilet paper.   Past Medical History:  Diagnosis Date   Allergic rhinitis    Anemia    Asthma    Chronic constipation    Depression    GERD (gastroesophageal reflux disease)    Hiatal hernia    Hypothyroidism    Interstitial cystitis    Irritable bowel syndrome    Obesity    OSA on CPAP    Overactive bladder    Paranoia (HCC)    Psoriasis     Past Surgical History:  Procedure Laterality Date   BLADDER REPAIR     CHOLECYSTECTOMY  1999   COLONOSCOPY  2011   single anal papilla   COLONOSCOPY WITH PROPOFOL N/A 03/03/2020    Surgeon: Corbin Ade, MD;  Redundant colon, normal-appearing TI and colonic mucosa, prominent anal papilla.  Repeat colonoscopy in 10 years.   ESOPHAGOGASTRODUODENOSCOPY  2006   patulous EGJ, small hh   ESOPHAGOGASTRODUODENOSCOPY  11/10/2010   NWG:NFAOZH esophagus/patulous EG junction/small HH   ESOPHAGOGASTRODUODENOSCOPY (EGD) WITH PROPOFOL N/A 03/03/2020   Surgeon: Corbin Ade, MD;  Normal examined esophagus, stomach, and duodenum.   TUBAL LIGATION Bilateral     Family History  Problem Relation Age of Onset   Ulcers Mother    Breast cancer Mother    Diabetes Father    Sleep apnea Sister     Anesthesia problems Neg Hx    Hypotension Neg Hx    Malignant hyperthermia Neg Hx    Pseudochol deficiency Neg Hx    Colon cancer Neg Hx     Social History   Tobacco Use   Smoking status: Never   Smokeless tobacco: Never  Substance Use Topics   Alcohol use: No   Drug use: No    Medications: I have reviewed the patient's current medications. Allergies as of 12/15/2020       Reactions   Amitiza [lubiprostone]    Nausea   Other Other (See Comments)   Pollen Sugar-dizziness, nausea        Medication List        Accurate as of December 15, 2020  1:54 PM. If you have any questions, ask your nurse or doctor.          STOP taking these medications    hydrocortisone 25 MG suppository Commonly known as: ANUSOL-HC Stopped by: Lucretia Roers, MD   nystatin cream Commonly known as: MYCOSTATIN Stopped by: Lucretia Roers, MD       TAKE these medications    acetaminophen 500 MG tablet Commonly known as: TYLENOL Take 1,000 mg by mouth every 6 (six) hours as needed for mild pain or moderate  pain.   aspirin EC 81 MG tablet Take 81 mg by mouth at bedtime.   baclofen 10 MG tablet Commonly known as: LIORESAL Take 10 mg by mouth 2 (two) times a day. Additional 10 mg if needed up the three time a day for muscle spasm   diazepam 5 MG tablet Commonly known as: VALIUM Take 5 mg by mouth 2 (two) times daily. Additional 5 mg  if needed   docusate sodium 250 MG capsule Commonly known as: COLACE Take 250 mg by mouth daily.   docusate sodium 100 MG capsule Commonly known as: COLACE Take 200 mg by mouth daily. 200 every day, then 100mg  every 3rd day   Fiber Powd Take 10 mLs by mouth 4 (four) times daily.   Latuda 80 MG Tabs tablet Generic drug: lurasidone Take 80 mg by mouth at bedtime.   levothyroxine 100 MCG tablet Commonly known as: SYNTHROID Take 1 tablet (100 mcg total) by mouth daily before breakfast. What changed: how much to take   Linzess 290 MCG  Caps capsule Generic drug: linaclotide TAKE ONE CAPSULE BY MOUTH EVERY DAY BEFORE BREAKFAST   meloxicam 15 MG tablet Commonly known as: MOBIC Take 15 mg by mouth at bedtime.   methylPREDNISolone 4 MG Tbpk tablet Commonly known as: MEDROL DOSEPAK Take 4 mg by mouth See admin instructions. Once a month as needed   omeprazole 40 MG capsule Commonly known as: PRILOSEC TAKE ONE CAPSULE BY MOUTH TWICE DAILY   pentosan polysulfate 100 MG capsule Commonly known as: ELMIRON Take 100 mg by mouth 3 (three) times daily before meals.   pregabalin 200 MG capsule Commonly known as: LYRICA Take 200 mg by mouth 3 (three) times daily.   promethazine 25 MG suppository Commonly known as: PHENERGAN INSERT ONE SUPPOSITORY RECTALLY EVERY 6 HOURS AS NEEDED FOR NAUSEA AND VOMITING What changed:  how much to take how to take this when to take this reasons to take this additional instructions   promethazine 25 MG tablet Commonly known as: PHENERGAN TAKE 1 TABLET BY MOUTH EVERY 8 HOURS AS NEEDED FOR NAUSEA AND VOMITING What changed: See the new instructions.   sertraline 50 MG tablet Commonly known as: ZOLOFT Take 50 mg by mouth at bedtime.   Symproic 0.2 MG Tabs Generic drug: Naldemedine Tosylate Take 0.2 mg by mouth daily.   traMADol 50 MG tablet Commonly known as: ULTRAM Take 50-100 mg by mouth every 8 (eight) hours as needed for moderate pain or severe pain.   Ventolin HFA 108 (90 Base) MCG/ACT inhaler Generic drug: albuterol INHALE TWO PUFFS EVERY 6 HOURS AS NEEDED WHEEZING SHORTNESS OF BREATH What changed: See the new instructions.         ROS:  A comprehensive review of systems was negative except for: Respiratory: positive for SOB Gastrointestinal: positive for abdominal pain, nausea, reflux symptoms, and rectal pain, constipation Genitourinary: positive for frequency and retention Musculoskeletal: positive for back pain, neck pain, and joint pain Neurological: positive  for dizziness and numbness  Blood pressure 122/77, pulse (!) 53, temperature 98.7 F (37.1 C), temperature source Other (Comment), resp. rate 12, height 5\' 5"  (1.651 m), weight 188 lb (85.3 kg), SpO2 98 %. Physical Exam Vitals reviewed. Exam conducted with a chaperone present.  Constitutional:      Appearance: Normal appearance.  HENT:     Head: Normocephalic.     Nose: Nose normal.     Mouth/Throat:     Mouth: Mucous membranes are moist.  Eyes:  Extraocular Movements: Extraocular movements intact.  Cardiovascular:     Rate and Rhythm: Normal rate and regular rhythm.  Pulmonary:     Effort: Pulmonary effort is normal.     Breath sounds: Normal breath sounds.  Abdominal:     General: There is no distension.     Palpations: Abdomen is soft.     Tenderness: There is no abdominal tenderness.  Genitourinary:    Comments: No external hemorrhoids noted on exam, valsalva with minor hemorrhoid protrustion in 3 columns that self reduce, healed chronic appearing fissure posteriorly and scar palpated in the region, tender Musculoskeletal:        General: Normal range of motion.     Cervical back: Normal range of motion.  Skin:    General: Skin is warm.  Neurological:     General: No focal deficit present.     Mental Status: She is alert and oriented to person, place, and time.  Psychiatric:        Mood and Affect: Mood normal.        Behavior: Behavior normal.        Thought Content: Thought content normal.        Judgment: Judgment normal.    Results: None    Assessment & Plan:  KYESHIA ZINN is a 45 y.o. female with a chronic fissure that is currently healed but signs of scarring in the area. She has chronic constipation. Discussed fissure and pathology. Discussed that she does not have large hemorrhoids and does not have any hemorrhoids to would need to be removed. Discussed that hemorrhoids surgery is painful. Discussed that I can offer her a fissure ointment Diltiazem 2%  Lidocaine 5% QID to see if this helps and could otherwise refer her for botox versus surgical options.   Offered follow up but she does not want to follow up at this time.  Called Diltiazem 2% Lidocaine 5% Compound to [facility] with 1 refill.    All questions were answered to the satisfaction of the patient.    Lucretia Roers 12/15/2020, 1:54 PM

## 2021-01-02 ENCOUNTER — Other Ambulatory Visit: Payer: Self-pay | Admitting: Internal Medicine

## 2021-01-13 ENCOUNTER — Ambulatory Visit: Payer: Medicare Other | Admitting: Gastroenterology

## 2021-01-23 ENCOUNTER — Ambulatory Visit: Payer: Medicare Other | Admitting: Gastroenterology

## 2021-05-24 ENCOUNTER — Ambulatory Visit: Payer: Medicare Other | Admitting: Gastroenterology

## 2021-06-14 ENCOUNTER — Ambulatory Visit (INDEPENDENT_AMBULATORY_CARE_PROVIDER_SITE_OTHER): Payer: Medicare Other | Admitting: Gastroenterology

## 2021-06-14 ENCOUNTER — Other Ambulatory Visit: Payer: Self-pay

## 2021-06-14 ENCOUNTER — Encounter: Payer: Self-pay | Admitting: Gastroenterology

## 2021-06-14 VITALS — BP 105/67 | HR 69 | Temp 97.7°F | Ht 65.0 in | Wt 183.4 lb

## 2021-06-14 DIAGNOSIS — D509 Iron deficiency anemia, unspecified: Secondary | ICD-10-CM | POA: Diagnosis not present

## 2021-06-14 DIAGNOSIS — K219 Gastro-esophageal reflux disease without esophagitis: Secondary | ICD-10-CM

## 2021-06-14 DIAGNOSIS — R110 Nausea: Secondary | ICD-10-CM

## 2021-06-14 DIAGNOSIS — K5903 Drug induced constipation: Secondary | ICD-10-CM

## 2021-06-14 MED ORDER — NALOXEGOL OXALATE 12.5 MG PO TABS: 25.0000 mg | ORAL_TABLET | Freq: Every day | ORAL | Status: DC

## 2021-06-14 NOTE — Patient Instructions (Addendum)
I unfortunately do not have any samples of movantik on hand. I do have a copay card that I am giving you. If you want to try this you would have to stop the Simproic. If you prefer to stay on your current regimen that is okay.  The movantik is to be taken 1 hour before your first meal or 2 hours after. I am sending it to the pharmacy for you to try.  I would recommend if you do to make sure you add miralax everyday to aid in having at least 3 softer stools a week.  There is also motegrity that is a stimulant that we can do in addition to your current regimen. We do have some samples of this. If you decide you want to try it feel free to call the office or stop by to pick up some samples.   Please call in 2 weeks with a progress report and/or stop by for samples.   It was a pleasure to see you today. I want to create trusting relationships with patients. If you receive a survey regarding your visit,  I greatly appreciate you taking time to fill this out on paper or through your MyChart. I value your feedback.  Venetia Night, MSN, FNP-BC, AGACNP-BC [facility]

## 2021-06-14 NOTE — Progress Notes (Addendum)
GI Office Note    Referring Provider: Ignatius Specking, MD Primary Care Physician:  Ignatius Specking, MD Primary GI: Dr. Jena Gauss  Date:  06/14/2021  ID:  Shirley Perry, DOB 14-Oct-1975, MRN [id number]   Chief Complaint   Chief Complaint  Patient presents with   Constipation   Nausea    Occ. Occ vomiting   Gastroesophageal Reflux    "Not too bad"     History of Present Illness  Shirley Perry is a 46 y.o. female presenting today with a history of IDA on iron supplementation, hemorrhoids, IBS, hypothyroid, PCOS, interstitial cystitis, anxiety, depression presenting for follow up on GERD, Nausea, and drug induced constipation.  GERD: taking omeprazole BID. Normal cramping pain. No sharp pains. No chest pains. Denies issues swallowing.   Drug induced Constipation: feels as though it is about the same. She has continued Linzess 290 mcg daily, Simproic daily, and 250 mg colace daily. Laxatives every 3 days. Has Bms about twice a week. She will take the laxatives and have multiple loose Bms (about 6-7 in a day). Sometimes has hemorrhoid pain, without bleeding- not lately. Sometimes has some straining. Denies hematochezia or melena. Preparation H suppositories for the hemorrhoids. Spends about 30 minutes on the commode- has a hard time figuring out when she's done. She has denied abdominal pain.   Nausea: Better. Is using both phenergan suppository and tablets, but usually uses the tablets. Sometimes vomits, small amounts, is not frequent though. Has not lost weight according to her. She is only down 5 lbs since hr last recorded weight in August 2022.   IDA: Is currently on iron supplementation. Last Hgb 11.9 in June 2022. She has had negative GI workup in the past and declined Givens capsule study to complete work up.   Last colonoscopy November 2021- Redundant and elongated colon, prominent anal papilla. Due in 2031.  Last EGD November 2021 - Normal   Past Medical History:  Diagnosis Date    Allergic rhinitis    Anemia    Asthma    Chronic constipation    Depression    GERD (gastroesophageal reflux disease)    Hiatal hernia    Hypothyroidism    Interstitial cystitis    Irritable bowel syndrome    Obesity    OSA on CPAP    Overactive bladder    Paranoia (HCC)    Psoriasis     Past Surgical History:  Procedure Laterality Date   BLADDER REPAIR     CHOLECYSTECTOMY  1999   COLONOSCOPY  2011   single anal papilla   COLONOSCOPY WITH PROPOFOL N/A 03/03/2020   Redundant colon, normal-appearing TI and colonic mucosa, prominent anal papilla.  Repeat colonoscopy in 10 years.   ESOPHAGOGASTRODUODENOSCOPY  2006   patulous EGJ, small hh   ESOPHAGOGASTRODUODENOSCOPY  11/10/2010   HAL:PFXTKW esophagus/patulous EG junction/small HH   ESOPHAGOGASTRODUODENOSCOPY (EGD) WITH PROPOFOL N/A 03/03/2020   Normal examined esophagus, stomach, and duodenum.   TUBAL LIGATION Bilateral     Current Outpatient Medications  Medication Sig Dispense Refill   aspirin EC 81 MG tablet Take 81 mg by mouth at bedtime.      baclofen (LIORESAL) 10 MG tablet Take 10 mg by mouth as needed.     bisacodyl (DULCOLAX) 5 MG EC tablet Take 25 mg by mouth every 3 (three) days.     diazepam (VALIUM) 5 MG tablet Take 5 mg by mouth 2 (two) times daily. Additional 5 mg  if needed  2   docusate sodium (COLACE) 100 MG capsule Take 100 mg by mouth daily.     docusate sodium (COLACE) 250 MG capsule Take 250 mg by mouth daily.     FEROSUL 325 (65 Fe) MG tablet Take 325 mg by mouth at bedtime.     Fiber POWD Take 10 mLs by mouth 3 (three) times daily.     ibuprofen (ADVIL) 800 MG tablet Take 800 mg by mouth as needed.     LATUDA 80 MG TABS tablet Take 80 mg by mouth at bedtime.   1   levothyroxine (SYNTHROID) 50 MCG tablet Take 50 mcg by mouth every morning.     LINZESS 290 MCG CAPS capsule TAKE ONE CAPSULE BY MOUTH EVERY DAY BEFORE BREAKFAST 30 capsule 11   methylPREDNISolone (MEDROL DOSEPAK) 4 MG TBPK tablet Take  4 mg by mouth See admin instructions. Once a month as needed     Naldemedine Tosylate (SYMPROIC) 0.2 MG TABS Take 0.2 mg by mouth daily. 30 tablet 5   omeprazole (PRILOSEC) 40 MG capsule TAKE ONE CAPSULE BY MOUTH TWICE DAILY 60 capsule 11   pentosan polysulfate (ELMIRON) 100 MG capsule Take 100 mg by mouth 3 (three) times daily before meals.     pregabalin (LYRICA) 200 MG capsule Take 200 mg by mouth 3 (three) times daily.     promethazine (PHENERGAN) 25 MG suppository INSERT ONE SUPPOSITORY RECTALLY EVERY 6 HOURS AS NEEDED FOR NAUSEA AND VOMITING 30 suppository 0   promethazine (PHENERGAN) 25 MG tablet TAKE 1 TABLET BY MOUTH EVERY 8 HOURS AS NEEDED FOR NAUSEA AND VOMITING (Patient taking differently: Take 25 mg by mouth every 6 (six) hours as needed for nausea or vomiting.) 20 tablet 2   sertraline (ZOLOFT) 50 MG tablet Take 50 mg by mouth at bedtime.     traMADol (ULTRAM) 50 MG tablet Take 50-100 mg by mouth every 8 (eight) hours as needed for moderate pain or severe pain.      VENTOLIN HFA 108 (90 Base) MCG/ACT inhaler INHALE TWO PUFFS EVERY 6 HOURS AS NEEDED WHEEZING SHORTNESS OF BREATH (Patient taking differently: Inhale 2 puffs into the lungs every 6 (six) hours as needed for wheezing or shortness of breath.) 18 g 2   acetaminophen (TYLENOL) 500 MG tablet Take 1,000 mg by mouth every 6 (six) hours as needed for mild pain or moderate pain.  (Patient not taking: Reported on 06/14/2021)     No current facility-administered medications for this visit.    Allergies as of 06/14/2021 - Review Complete 06/14/2021  Allergen Reaction Noted   Amitiza [lubiprostone]  02/08/2020   Other Other (See Comments) 09/11/2019    Family History  Problem Relation Age of Onset   Ulcers Mother    Breast cancer Mother    Diabetes Father    Sleep apnea Sister    Anesthesia problems Neg Hx    Hypotension Neg Hx    Malignant hyperthermia Neg Hx    Pseudochol deficiency Neg Hx    Colon cancer Neg Hx      Social History   Socioeconomic History   Marital status: Divorced    Spouse name: Not on file   Number of children: 2   Years of education: College   Highest education level: Not on file  Occupational History   Occupation: unemployed    Employer: UNEMPLOYED    Employer: NOT EMPLOYED  Tobacco Use   Smoking status: Never   Smokeless tobacco: Never  Substance and Sexual Activity  Alcohol use: No   Drug use: No   Sexual activity: Never  Other Topics Concern   Not on file  Social History Narrative   Patient lives at home with her daughter.   Caffeine Use: some   Social Determinants of Radio broadcast assistant Strain: Not on file  Food Insecurity: Not on file  Transportation Needs: Not on file  Physical Activity: Not on file  Stress: Not on file  Social Connections: Not on file     Review of Systems   Gen: Denies fever, chills, anorexia. Denies fatigue, weakness, weight loss.  CV: Denies chest pain, palpitations, syncope, peripheral edema, and claudication. Resp: Denies dyspnea at rest, cough, wheezing, coughing up blood, and pleurisy. GI: see HPI Derm: Denies rash, itching, dry skin Psych: + depression, anxiety. Denies memory loss, confusion. No homicidal or suicidal ideation.  Heme: Denies bruising, bleeding, and enlarged lymph nodes.   Physical Exam   BP 105/67    Pulse 69    Temp 97.7 F (36.5 C) (Temporal)    Ht 5\' 5"  (1.651 m)    Wt 183 lb 6.4 oz (83.2 kg)    LMP 06/05/2021 (Approximate)    BMI 30.52 kg/m   General:   Alert and oriented. No distress noted. Pleasant and cooperative.  Head:  Normocephalic and atraumatic. Eyes:  Conjuctiva clear without scleral icterus. Mouth:  Oral mucosa pink and moist. No lesions. Lungs:  Clear to auscultation bilaterally. No wheezes, rales, or rhonchi. No distress.  Heart:  S1, S2 present without murmurs appreciated.  Abdomen:  +BS, soft, mild tenderness to RLQ on palpation and non-distended. No rebound or  guarding. No HSM or masses noted. Rectal: deferred Msk:  Symmetrical without gross deformities. Normal posture. Extremities:  Without edema. Neurologic:  Alert and  oriented x4 Psych:  Alert and cooperative. Normal mood and affect.   Assessment  Shirley Perry is a 46 y.o. female with history of IDA on iron supplementation, hemorrhoids, IBS, hypothyroid, PCOS, interstitial cystitis, anxiety, depression presenting for follow up on GERD, Nausea, and drug induced constipation.  GERD: Doing well on omeprazole 40 mg BID. No real complaints in breakthrough symptoms.   Drug induced Constipation: She has twice weekly occurrences of loose stool after she takes her laxative regimen. She does this every 3 days. She likes her current regimen but is open to trying something in addition to it. Will give her some samples of Motegrity to aid in more regularity in her bowel pattern and have her follow up in 2 weeks with a progress report and will send in a prescription if this is helpful. Also recommend daily 1 capful of Miralax. Decreasing constipation will help decrease risk of hemorrhoids.     Nausea: Feels it has been better and only has occasional very small amounts of vomit. Her nausea and vomiting is likely to be secondary to chronic constipation. Since her last visit she is no longer taking meloxicam. Last GES completed in 2012 was normal. She is now on multiple therapies for constipation with un-resolving nausea. If she improves regularity in her bowels and this does not improve symptoms then could consider repeating this in the future.   IDA: last Hgb stable. Continue iron supplements. No further workup at this time. Likely from past heavy menstrual bleeding.   PLAN   Will try Motegrity daily. Would recommend Miralax addition to daily regimen as well.  Call with a progress report in 2 weeks .Otherwise f/u in 6 months.  Continue  other medications for constipation, GERD, and nausea.   Venetia Night, MSN, FNP-BC, AGACNP-BC [facility]

## 2021-06-15 ENCOUNTER — Telehealth: Payer: Self-pay

## 2021-06-15 NOTE — Telephone Encounter (Signed)
error 

## 2021-06-27 ENCOUNTER — Telehealth: Payer: Self-pay | Admitting: Gastroenterology

## 2021-06-27 MED ORDER — MOTEGRITY 2 MG PO TABS
2.0000 mg | ORAL_TABLET | Freq: Every day | ORAL | 5 refills | Status: DC
Start: 2021-06-27 — End: 2021-11-15

## 2021-06-27 NOTE — Addendum Note (Signed)
Addended by: Aida Raider on: 06/27/2021 04:19 PM ? ? Modules accepted: Orders ? ?

## 2021-06-27 NOTE — Telephone Encounter (Signed)
Spoke to pt, she informed me that Motegrity was working some. She would like to know if the dosage could be increased. She thinks that would help.  ?

## 2021-06-27 NOTE — Telephone Encounter (Signed)
Patient called and said motegrity samples worked and she wants to know if it can be increased.   ?

## 2021-06-27 NOTE — Telephone Encounter (Signed)
Spoke to pt, she informed me that she is taking Motegrity 2mg   ?

## 2021-06-27 NOTE — Telephone Encounter (Signed)
Noted  

## 2021-08-04 ENCOUNTER — Telehealth: Payer: Self-pay | Admitting: *Deleted

## 2021-08-04 DIAGNOSIS — K5903 Drug induced constipation: Secondary | ICD-10-CM

## 2021-08-04 NOTE — Telephone Encounter (Signed)
Spoke with Tammy she had already done PA and submitted an appeal ?

## 2021-08-04 NOTE — Telephone Encounter (Signed)
Routing to Brooke Bonito, NP who recently saw this patient as an Financial planner.  ?

## 2021-08-04 NOTE — Telephone Encounter (Signed)
Shirley Perry with [facility] call in. Checking to see if PA was received for pt motegrity. ?

## 2021-08-04 NOTE — Telephone Encounter (Signed)
Noted  

## 2021-08-04 NOTE — Telephone Encounter (Signed)
Spoke with the pt and advised a PA was done for Motegrity on the 10th and was PA was denied and appeal was done and it was. Waiting on response from the appeals dept. ?

## 2021-08-07 ENCOUNTER — Telehealth: Payer: Self-pay

## 2021-08-07 NOTE — Telephone Encounter (Signed)
PA for motegrity has been denied as well as the appeal. It is not on her formulary. Tried contacting the pt but was unsuccessful. Pt will need to find out what is covered by her insurance.  ?

## 2021-08-15 ENCOUNTER — Telehealth: Payer: Self-pay | Admitting: Gastroenterology

## 2021-08-15 NOTE — Telephone Encounter (Signed)
Spoke to pt, she informed me that she has called her urologist to see if she was previously on Movantik and is awaiting there call back. She states Amitiza makes her nauseated and she is already taking Linzess.  ?

## 2021-08-15 NOTE — Telephone Encounter (Signed)
Received appeal letter from Medicare regarding denial of Motegrity. There are other options to try if she would like. We could try movantik instead of Symproic or we could try Amitiza in place of her Linzess. ? ?Brooke Bonito, MSN, FNP-BC, AGACNP-BC ?[facility] ? ?

## 2021-08-16 ENCOUNTER — Telehealth: Payer: Self-pay | Admitting: Internal Medicine

## 2021-08-16 DIAGNOSIS — K5903 Drug induced constipation: Secondary | ICD-10-CM

## 2021-08-16 NOTE — Telephone Encounter (Signed)
[phone]  PLEASE CALL PATIENT, SHE HAS A MEDICATION QUESTION,  HER INSURANCE IS DENYING ONE SHE WAS TO BE PUT ON  ?

## 2021-08-16 NOTE — Telephone Encounter (Signed)
Spoke to pt, she informed me that her insurance will not pay for Symproic. So she would like to try Movantik. Please send to pharmacy  ?

## 2021-08-21 ENCOUNTER — Telehealth: Payer: Self-pay | Admitting: *Deleted

## 2021-08-21 MED ORDER — NALOXEGOL OXALATE 12.5 MG PO TABS: 25.0000 mg | ORAL_TABLET | Freq: Every day | ORAL | Status: DC

## 2021-08-21 MED ORDER — NALOXEGOL OXALATE 25 MG PO TABS: 25.0000 mg | ORAL_TABLET | Freq: Every day | ORAL | 1 refills | Status: DC

## 2021-08-21 NOTE — Addendum Note (Signed)
Addended by: Aida Raider on: 08/21/2021 09:51 AM ? ? Modules accepted: Orders ? ?

## 2021-08-21 NOTE — Addendum Note (Signed)
Addended by: Sherron Monday on: 08/21/2021 09:53 AM ? ? Modules accepted: Orders ? ?

## 2021-08-21 NOTE — Telephone Encounter (Signed)
See previous note

## 2021-08-21 NOTE — Telephone Encounter (Signed)
Noted  

## 2021-08-21 NOTE — Telephone Encounter (Signed)
Spoke to pt, she informed me that Movantik was called into her pharmacy today and she would go and pick it up.  ?

## 2021-08-21 NOTE — Telephone Encounter (Signed)
Patient called in and asking for courtney to call her ?

## 2021-08-21 NOTE — Telephone Encounter (Signed)
Sent in prescription for Movantik 25 mg daily.   ? ?Please make sure she has stopped taking Naldemedine Tosylate (SYMPROIC) 0.2 mg daily.  She previously stated her insurance would not be for Symproic but at her last visit she stated that she was taking it.  These medications are in the same class and should not be taken together. ? ?Please let her know that if she begins to have abdominal pain or significant diarrhea to call us back.  We can decrease her dose in half.  If she begins to have severe abdominal pain please stop the medication.  If for any reason she stops taking patient she needs to stop this medication. ? ?Brooke Bonito, MSN, FNP-BC, AGACNP-BC ?[facility] ? ? ?

## 2021-09-06 ENCOUNTER — Telehealth: Payer: Self-pay | Admitting: *Deleted

## 2021-09-06 NOTE — Telephone Encounter (Signed)
Prescription for Motegrity is still active.  She can request the [facility] drug fill this for her and reach out for [place] for prior authorization and/or appeal if need be now that she has failed Amitiza and Movantik. ? ?Brooke Bonito, MSN, FNP-BC, AGACNP-BC ?[facility] ? ?

## 2021-09-06 NOTE — Telephone Encounter (Signed)
Patient called states she was on motegrity and it was working well for her and then insurance would no longer cover unless she tried and failed both motvanik and [place]. She states both motvanik and amitiza cause her to have nausea and would like to see if she can go back on motegrity.  ? ?[phone] ? ?[facility]  ?

## 2021-09-07 NOTE — Telephone Encounter (Signed)
Pt was made aware, will be on the lookout for PA.

## 2021-09-19 ENCOUNTER — Other Ambulatory Visit: Payer: Self-pay | Admitting: Gastroenterology

## 2021-11-09 ENCOUNTER — Encounter: Payer: Self-pay | Admitting: Internal Medicine

## 2021-11-15 ENCOUNTER — Other Ambulatory Visit: Payer: Self-pay

## 2021-11-15 MED ORDER — MOTEGRITY 2 MG PO TABS: 2.0000 mg | ORAL_TABLET | Freq: Every day | ORAL | 5 refills | Status: DC

## 2021-11-23 ENCOUNTER — Telehealth: Payer: Self-pay | Admitting: Internal Medicine

## 2021-11-23 NOTE — Telephone Encounter (Signed)
Pt called to say that she felt fine and didn't want to follow up from her previous visit in February with Brooke Bonito, NP, but she did want to speak with the nurse about an appeal on her medication. [phone]

## 2021-11-24 NOTE — Telephone Encounter (Signed)
Spoke to pt, informed her that an appeal was done for the Motrgrity and it was denied. Informed her that we would request another PA.

## 2021-12-14 ENCOUNTER — Telehealth: Payer: Self-pay | Admitting: *Deleted

## 2021-12-14 ENCOUNTER — Other Ambulatory Visit: Payer: Self-pay | Admitting: Gastroenterology

## 2021-12-14 DIAGNOSIS — K5904 Chronic idiopathic constipation: Secondary | ICD-10-CM

## 2021-12-14 DIAGNOSIS — K5903 Drug induced constipation: Secondary | ICD-10-CM

## 2021-12-14 MED ORDER — MOTEGRITY 2 MG PO TABS: 2.0000 mg | ORAL_TABLET | Freq: Every day | ORAL | 3 refills | Status: DC

## 2021-12-14 NOTE — Telephone Encounter (Signed)
Spoke to pt, she informed me that she is still having problems having a bowel movement. Could you please send in another prescription for Motegrity  2mg   to pharmacy. Not sure why it was denied. But I will try and do a PA.

## 2021-12-15 NOTE — Telephone Encounter (Signed)
Noted  

## 2021-12-15 NOTE — Telephone Encounter (Signed)
Pt called again and states she would like to try Trulance, because she doesn't think her insurance will cover Motegrity.

## 2021-12-18 MED ORDER — TRULANCE 3 MG PO TABS: 3.0000 mg | ORAL_TABLET | Freq: Every day | ORAL | 5 refills | Status: DC

## 2021-12-18 NOTE — Telephone Encounter (Signed)
Spoke to pt, she states that would be fine to try Trulance and stop Linzess.

## 2021-12-18 NOTE — Addendum Note (Signed)
Addended by: Aida Raider on: 12/18/2021 02:40 PM   Modules accepted: Orders

## 2021-12-19 NOTE — Telephone Encounter (Signed)
Noted  

## 2021-12-23 ENCOUNTER — Other Ambulatory Visit: Payer: Self-pay | Admitting: Gastroenterology

## 2022-01-02 ENCOUNTER — Telehealth: Payer: Self-pay | Admitting: *Deleted

## 2022-01-02 NOTE — Telephone Encounter (Signed)
Received approval letter for Trulance 3mg . Scanned into chart.

## 2022-01-16 ENCOUNTER — Other Ambulatory Visit: Payer: Self-pay | Admitting: Gastroenterology

## 2022-01-23 ENCOUNTER — Other Ambulatory Visit: Payer: Self-pay | Admitting: Gastroenterology

## 2022-02-15 ENCOUNTER — Encounter: Payer: Medicare Other | Admitting: Adult Health

## 2022-03-27 ENCOUNTER — Other Ambulatory Visit: Payer: Self-pay | Admitting: Gastroenterology

## 2022-03-28 NOTE — Telephone Encounter (Signed)
Pt aware of her Rx being sent in. She has picked it up

## 2022-04-26 ENCOUNTER — Telehealth: Payer: Self-pay | Admitting: *Deleted

## 2022-04-26 ENCOUNTER — Other Ambulatory Visit: Payer: Self-pay | Admitting: Gastroenterology

## 2022-04-26 MED ORDER — MOTEGRITY 2 MG PO TABS: 2.0000 mg | ORAL_TABLET | Freq: Every day | ORAL | 1 refills | Status: DC

## 2022-04-26 NOTE — Telephone Encounter (Signed)
Pt called and states she would like a prescription for Motegrity sent to her pharmacy. She thinks her insurance my cover it because it is a new year.

## 2022-04-30 ENCOUNTER — Telehealth: Payer: Self-pay

## 2022-04-30 NOTE — Telephone Encounter (Signed)
We received a referral from [place] for pt. I tried calling patient no answer but I did leave a detail voice mail to call me back. The patient is not signed up for my chart. ep

## 2022-05-07 ENCOUNTER — Telehealth: Payer: Self-pay

## 2022-05-07 NOTE — Telephone Encounter (Signed)
I tried calling pt again this morning to schedule appt for the referral that was received from [place]. No answer, but I did leave a msg for patient to call me back to schedule. ep

## 2022-05-10 ENCOUNTER — Telehealth: Payer: Self-pay

## 2022-05-10 NOTE — Telephone Encounter (Signed)
Pt called and states she was denied again for Motegrity. She would like [place] to an appeal. I informed her that I had not received any notice of appeal, but when I do I will work on it.

## 2022-05-10 NOTE — Telephone Encounter (Signed)
I called the patient again this morning to schedule appointment for the referral that was received, I left a voice mail for the patient to call the office back. This was the third time trying to call patient. She refused last year. I will  try to call patient one more time. ep

## 2022-05-15 ENCOUNTER — Telehealth: Payer: Self-pay

## 2022-05-15 NOTE — Telephone Encounter (Signed)
I just tired calling patient for the 4 time in regards to the referral that was received from [place]. I have left messages for the patient to call me back at [phone] and the main number [phone], and due to no respond disregarding referral. Faxed message to [facility] that we were not able to get in touch with patient. ep

## 2022-06-25 ENCOUNTER — Telehealth (INDEPENDENT_AMBULATORY_CARE_PROVIDER_SITE_OTHER): Payer: Medicare Other | Admitting: Gastroenterology

## 2022-06-25 ENCOUNTER — Telehealth: Payer: Self-pay | Admitting: *Deleted

## 2022-06-25 ENCOUNTER — Encounter: Payer: Self-pay | Admitting: Gastroenterology

## 2022-06-25 VITALS — Ht 65.0 in | Wt 180.0 lb

## 2022-06-25 DIAGNOSIS — D509 Iron deficiency anemia, unspecified: Secondary | ICD-10-CM | POA: Diagnosis not present

## 2022-06-25 DIAGNOSIS — K5903 Drug induced constipation: Secondary | ICD-10-CM | POA: Diagnosis not present

## 2022-06-25 DIAGNOSIS — R110 Nausea: Secondary | ICD-10-CM

## 2022-06-25 DIAGNOSIS — K219 Gastro-esophageal reflux disease without esophagitis: Secondary | ICD-10-CM

## 2022-06-25 MED ORDER — MOTEGRITY 2 MG PO TABS: 2.0000 mg | ORAL_TABLET | Freq: Every day | ORAL | 2 refills | Status: DC

## 2022-06-25 NOTE — Telephone Encounter (Signed)
Shirley Perry, you are scheduled for a virtual visit with your provider today.  Just as we do with appointments in the office, we must obtain your consent to participate.  Your consent will be active for this visit and any virtual visit you may have with one of our providers in the next 365 days.  If you have a MyChart account, I can also send a copy of this consent to you electronically.  All virtual visits are billed to your insurance company just like a traditional visit in the office.  As this is a virtual visit, video technology does not allow for your provider to perform a traditional examination.  This may limit your provider's ability to fully assess your condition.  If your provider identifies any concerns that need to be evaluated in person or the need to arrange testing such as labs, EKG, etc, we will make arrangements to do so.  Although advances in technology are sophisticated, we cannot ensure that it will always work on either your end or our end.  If the connection with a video visit is poor, we may have to switch to a telephone visit.  With either a video or telephone visit, we are not always able to ensure that we have a secure connection.   I need to obtain your verbal consent now.   Are you willing to proceed with your visit today? Patient consent to virtual visit.

## 2022-06-25 NOTE — Progress Notes (Signed)
Primary Care Physician:  Glenda Chroman, MD  Primary Gastroenterologist: Cristopher Estimable. Rourk, MD  Patient Location: Home Reason for Visit: follow up  Persons present on the virtual encounter, with roles: Patient - Shirley Perry; Provider - Venetia Night, NP   Total time (minutes) spent on medical discussion: 20 minutes  Virtual Visit Encounter Note Visit is conducted virtually and was requested by patient.   I connected with Shirley Perry on 06/25/22 at  3:30 PM EST by video and verified that I am speaking with the correct person using two identifiers.   I discussed the limitations, risks, security and privacy concerns of performing an evaluation and management service by video and the availability of in person appointments. I also discussed with the patient that there may be a patient responsible charge related to this service. The patient expressed understanding and agreed to proceed.  Chief Complaint  Patient presents with   Follow-up    Nausea and constipation      History of Present Illness: Shirley Perry is a 47 y.o. female with a history of IDA on iron supplementation, hemorrhoids, IBS, hypothyroid, PCOS, interstitial cystitis, anxiety and depression presenting for virtual visit today for follow up.   Last colonoscopy November 2021- Redundant and elongated colon, prominent anal papilla. Due in 2031.   Last EGD November 2021 - Normal  Last office visit 06/14/21. Taking omeprazole BID. On Linzess 290 mcg daily, simproic daily, and colace daily. Taking laxatives every 3 days. BM twice per week. Spends 30 minutes or more on commode. Nausea improved from prior with phenergan suppositories and tablets. On iron supplementation. Declined givens in the past. Trial motegrity advised. Add miralax to current regimen.   Multiple telephone calls to the office over the last year. Multiple changes and trials regarding constipation regimen. Most recently attempted to resubmit for  motegrity.   Labs 06/13/2022: Hemoglobin 13.6, MCV 85.4, platelets 125, iron 38, iron sat 13%, ferritin 29.3, normal CMP.  Today: Constipation - Wants to try Motegrity given change in insurance.  Did not have good results with Trulance in the past. Tried Amitiza in the past as well without much relief. Having a BM every 3 days or the day after she takes the bisacodyl tablets. Having back pain, fibromyalgia, arthritis, pain. Has occasion abdominal pain with nausea. Doing well with water intake. No very physically active. Still on Lyrica. Takes oxycodone 10 mg every 6 hours or up to 4 times per day. Is consistent about taking it. Still taking iron daily.  Taking fiber supplement 3 times daily. Reports she had tried miralax in the past but felt like it was not helping as much.   Current regimen: -stool softener (colace) 2  in the morning -bisacodyl 6 tablets every 3 days -Linzess 290 mcg daily.   No melena, brbpr, overt dysphagia (rare occurrences).  Has good appetite just deterred due to nausea. Has an upcoming thyroid check. Reports low energy levels on a daily basis. Has had PICA in the past but nothing lately.   GERD - Using phenergan suppository as needed for nausea. When she drink a soda she burps a lot and then feels better. Only drinks the occasionally. Has nausea daily but is worse on the days she takes the laxatives. Doing fair with eating but at times it is hard to eat or drink. Weight is stable. Able to eat most days despite having nausea.    Medications Current Meds  Medication Sig   aspirin EC  81 MG tablet Take 81 mg by mouth at bedtime.    baclofen (LIORESAL) 10 MG tablet Take 10 mg by mouth as needed.   bisacodyl (DULCOLAX) 5 MG EC tablet Take 25 mg by mouth every 3 (three) days.   busPIRone (BUSPAR) 5 MG tablet Take by mouth.   docusate sodium (COLACE) 100 MG capsule Take 100 mg by mouth daily.   docusate sodium (COLACE) 250 MG capsule Take 250 mg by mouth daily.   FEROSUL 325  (65 Fe) MG tablet Take 325 mg by mouth at bedtime.   Fiber POWD Take 10 mLs by mouth 3 (three) times daily.   ibuprofen (ADVIL) 800 MG tablet Take 800 mg by mouth as needed.   LATUDA 80 MG TABS tablet Take 80 mg by mouth at bedtime.    levothyroxine (SYNTHROID) 50 MCG tablet Take 50 mcg by mouth every morning.   meclizine (ANTIVERT) 12.5 MG tablet Take by mouth.   meloxicam (MOBIC) 7.5 MG tablet Take by mouth.   methylPREDNISolone (MEDROL DOSEPAK) 4 MG TBPK tablet Take 4 mg by mouth See admin instructions. Once a month as needed   omeprazole (PRILOSEC) 40 MG capsule TAKE ONE CAPSULE BY MOUTH TWICE DAILY   Oxycodone HCl 10 MG TABS    pentosan polysulfate (ELMIRON) 100 MG capsule Take 100 mg by mouth 3 (three) times daily before meals.   pregabalin (LYRICA) 200 MG capsule Take 200 mg by mouth 3 (three) times daily.   promethazine (PHENERGAN) 25 MG suppository INSERT 1 SUPPOSITORY RECTALLY EVERY 6 HOURS AS NEEDED FOR NAUSEA AND VOMITING   promethazine (PHENERGAN) 25 MG tablet TAKE 1 TABLET BY MOUTH EVERY 8 HOURS AS NEEDED FOR NAUSEA AND VOMITING   sertraline (ZOLOFT) 50 MG tablet Take 50 mg by mouth at bedtime.   VENTOLIN HFA 108 (90 Base) MCG/ACT inhaler INHALE TWO PUFFS EVERY 6 HOURS AS NEEDED WHEEZING SHORTNESS OF BREATH (Patient taking differently: Inhale 2 puffs into the lungs every 6 (six) hours as needed for wheezing or shortness of breath.)     History Past Medical History:  Diagnosis Date   Allergic rhinitis    Anemia    Asthma    Chronic constipation    Depression    GERD (gastroesophageal reflux disease)    Hiatal hernia    Hypothyroidism    Interstitial cystitis    Irritable bowel syndrome    Obesity    OSA on CPAP    Overactive bladder    Paranoia ([place])    Psoriasis     Past Surgical History:  Procedure Laterality Date   BLADDER REPAIR     CHOLECYSTECTOMY  1999   COLONOSCOPY  2011   single anal papilla   COLONOSCOPY WITH PROPOFOL N/A 03/03/2020   Redundant  colon, normal-appearing TI and colonic mucosa, prominent anal papilla.  Repeat colonoscopy in 10 years.   ESOPHAGOGASTRODUODENOSCOPY  2006   patulous EGJ, small hh   ESOPHAGOGASTRODUODENOSCOPY  11/10/2010   [id number] esophagus/patulous EG junction/small HH   ESOPHAGOGASTRODUODENOSCOPY (EGD) WITH PROPOFOL N/A 03/03/2020   Normal examined esophagus, stomach, and duodenum.   TUBAL LIGATION Bilateral     Family History  Problem Relation Age of Onset   Ulcers Mother    Breast cancer Mother    Diabetes Father    Sleep apnea Sister    Anesthesia problems Neg Hx    Hypotension Neg Hx    Malignant hyperthermia Neg Hx    Pseudochol deficiency Neg Hx    Colon cancer Neg Hx  Social History   Socioeconomic History   Marital status: Divorced    Spouse name: Not on file   Number of children: 2   Years of education: College   Highest education level: Not on file  Occupational History   Occupation: unemployed    Employer: UNEMPLOYED    Employer: NOT EMPLOYED  Tobacco Use   Smoking status: Never   Smokeless tobacco: Never  Substance and Sexual Activity   Alcohol use: No   Drug use: No   Sexual activity: Never  Other Topics Concern   Not on file  Social History Narrative   Patient lives at home with her daughter.   Caffeine Use: some   Social Determinants of Radio broadcast assistant Strain: Not on file  Food Insecurity: Not on file  Transportation Needs: Not on file  Physical Activity: Not on file  Stress: Not on file  Social Connections: Not on file      Review of Systems: Gen: Denies fever, chills, anorexia. Denies fatigue, weakness, weight loss.  CV: Denies chest pain, palpitations, syncope, peripheral edema, and claudication. Resp: Denies dyspnea at rest, cough, wheezing, coughing up blood, and pleurisy. GI: see HPI Derm: Denies rash, itching, dry skin Psych: Denies depression, anxiety, memory loss, confusion. No homicidal or suicidal ideation.  Heme: Denies  bruising, bleeding, and enlarged lymph nodes.  Observations/Objective: No distress. Alert and oriented. Pleasant. Well nourished. Normal mood and affect. Unable to perform complete physical exam due to video encounter. Redness around bilateral eyes.    Assessment:  Nausea: Continues to have daily nausea, rare vomiting.  GES completed in 2012 that was normal.  Despite multimodal treatment of constipation and treatment for GERD, she continues to have nausea.  Using Phenergan daily as well as suppositories as needed for breakthrough relief.  This does not affect her ability to maintain her weight.  Recent electrolytes stable.  Awaiting approval/denial of Motegrity as this would likely benefit her from a nausea standpoint as well as her constipation.  If Motegrity not able to be approved and she continues to struggle with ongoing nausea and may be worth updating GES.  Constipation: Drug induced likely.  Oral iron likely also contributing.  Reports good water intake.  Current regimen consist of 2 stool softeners in the morning daily, bisacodyl 6 tablets every 3 days, fiber supplementation 3 times daily, and Linzess 290 mcg daily only has a BM every 3-4 days when she takes bisacodyl.  Previously has preferred this regimen however would like to resubmit to see if Motegrity can be approved or if appeal needed.  Again recommended MiraLAX in addition to her current regimen in place of using stimulant laxative every 3 days however patient would prefer to wait trying this until seeing if Motegrity can be approved.  GERD: Notes burping a lot when she drinks a soda however she does this rarely.  Typical reflux symptoms controlled on omeprazole 40 mg twice daily however does continue to have daily nausea.  Does have a good appetite however just scared to eat regarding her nausea.  IDA: Most recent hemoglobin 06/13/2022 stable at 13.8.  Has continued on oral iron supplementation.  Recommend continuing this.  Suspected  to be due to prior heavy menstrual bleeding.  Has had pica in the past, but nothing recently. Essentially normal colonoscopy and EGD in November 2021.  Continues to have fatigue.  Plan:  Continue omeprazole '40mg'$  BID.  Resubmit for Motegrity 2 mg daily Continue Linzess 290 mcg daily Continue Colace  100 mg twice daily Continue fiber supplementation 3 times daily Continue bisacodyl every 3 days Continue daily oral iron supplement. Continue Phenergan tablets every 8 hours as needed and Phenergan suppositories every 6 hours as needed for breakthrough. Discussed plan to add MiraLAX in place of bisacodyl however patient would prefer to continue bisacodyl for now while awaiting to see if Motegrity can be approved.    Follow Up Instructions: Follow up in 3 months, okay for virtual if she wishes.   I discussed the assessment and treatment plan with the patient. The patient was provided an opportunity to ask questions and all were answered. The patient agreed with the plan and demonstrated an understanding of the instructions.   The patient was advised to call back or seek an in-person evaluation if the symptoms worsen or if the condition fails to improve as anticipated.   Venetia Night, MSN, APRN, FNP-BC, AGACNP-BC [facility]

## 2022-06-25 NOTE — Patient Instructions (Addendum)
Continue Linzess 290 mcg daily Continue Colace 100 mg twice daily Continue bisacodyl every 3 days Continue fiber supplementation 3 times daily We will resubmit your Motegrity to the pharmacy.  I would recommend that if you have not heard anything regarding the medication within 1-2 weeks to follow-up with the pharmacy and have them send [place] prior authorization paperwork to submit. You may also need to reach out to your insurance regarding what is needed to perform an appeal.  Continue Phenergan tablets and suppositories as needed.  Plan to follow-up in 3 months.  Okay for virtual if you request.  It was a pleasure to see you today. I want to create trusting relationships with patients. If you receive a survey regarding your visit,  I greatly appreciate you taking time to fill this out on paper or through your MyChart. I value your feedback.  Venetia Night, MSN, FNP-BC, AGACNP-BC [facility]

## 2022-06-25 NOTE — Progress Notes (Incomplete)
Primary Care Physician:  Glenda Chroman, MD  Primary Gastroenterologist: Cristopher Estimable. Rourk, MD  Patient Location: Home Reason for Visit: follow up  Persons present on the virtual encounter, with roles: Patient - Shirley Perry; Provider - Venetia Night, NP   Total time (minutes) spent on medical discussion: *** minutes  Virtual Visit Encounter Note Visit is conducted virtually and was requested by patient.   I connected with Shirley Perry on 06/25/22 at  3:30 PM EST by video*** and verified that I am speaking with the correct person using two identifiers.   I discussed the limitations, risks, security and privacy concerns of performing an evaluation and management service by video*** and the availability of in person appointments. I also discussed with the patient that there may be a patient responsible charge related to this service. The patient expressed understanding and agreed to proceed.  No chief complaint on file.    History of Present Illness: Shirley Perry is a 47 y.o. female with a history of IDA on iron supplementation, hemorrhoids, IBS, hypothyroid, PCOS, interstitial cystitis, anxiety and depression presenting for virtual visit today for follow up.   Last office visit 06/14/21. ***    Medications No outpatient medications have been marked as taking for the 06/25/22 encounter (Appointment) with Sherron Monday, NP.     History Past Medical History:  Diagnosis Date  . Allergic rhinitis   . Anemia   . Asthma   . Chronic constipation   . Depression   . GERD (gastroesophageal reflux disease)   . Hiatal hernia   . Hypothyroidism   . Interstitial cystitis   . Irritable bowel syndrome   . Obesity   . OSA on CPAP   . Overactive bladder   . Paranoia ([place])   . Psoriasis     Past Surgical History:  Procedure Laterality Date  . BLADDER REPAIR    . CHOLECYSTECTOMY  1999  . COLONOSCOPY  2011   single anal papilla  . COLONOSCOPY WITH PROPOFOL N/A  03/03/2020   Redundant colon, normal-appearing TI and colonic mucosa, prominent anal papilla.  Repeat colonoscopy in 10 years.  . ESOPHAGOGASTRODUODENOSCOPY  2006   patulous EGJ, small hh  . ESOPHAGOGASTRODUODENOSCOPY  11/10/2010   [id number] esophagus/patulous EG junction/small HH  . ESOPHAGOGASTRODUODENOSCOPY (EGD) WITH PROPOFOL N/A 03/03/2020   Normal examined esophagus, stomach, and duodenum.  . TUBAL LIGATION Bilateral     Family History  Problem Relation Age of Onset  . Ulcers Mother   . Breast cancer Mother   . Diabetes Father   . Sleep apnea Sister   . Anesthesia problems Neg Hx   . Hypotension Neg Hx   . Malignant hyperthermia Neg Hx   . Pseudochol deficiency Neg Hx   . Colon cancer Neg Hx     Social History   Socioeconomic History  . Marital status: Divorced    Spouse name: Not on file  . Number of children: 2  . Years of education: College  . Highest education level: Not on file  Occupational History  . Occupation: unemployed    Fish farm manager: UNEMPLOYED    Employer: NOT EMPLOYED  Tobacco Use  . Smoking status: Never  . Smokeless tobacco: Never  Substance and Sexual Activity  . Alcohol use: No  . Drug use: No  . Sexual activity: Never  Other Topics Concern  . Not on file  Social History Narrative   Patient lives at home with her daughter.   Caffeine Use:  some   Social Determinants of Radio broadcast assistant Strain: Not on file  Food Insecurity: Not on file  Transportation Needs: Not on file  Physical Activity: Not on file  Stress: Not on file  Social Connections: Not on file      Review of Systems: Gen: Denies fever, chills, anorexia. Denies fatigue, weakness, weight loss.  CV: Denies chest pain, palpitations, syncope, peripheral edema, and claudication. Resp: Denies dyspnea at rest, cough, wheezing, coughing up blood, and pleurisy. GI: see HPI Derm: Denies rash, itching, dry skin Psych: Denies depression, anxiety, memory loss, confusion. No  homicidal or suicidal ideation.  Heme: Denies bruising, bleeding, and enlarged lymph nodes.  Observations/Objective: No distress. Alert and oriented. Pleasant. Well nourished. Normal mood and affect. Unable to perform complete physical exam due to video*** encounter.   Assessment:  Nausea:   Constipation: Drug induced likely.   GERD:   IDA:   Plan:      Follow Up Instructions:  I discussed the assessment and treatment plan with the patient. The patient was provided an opportunity to ask questions and all were answered. The patient agreed with the plan and demonstrated an understanding of the instructions.   The patient was advised to call back or seek an in-person evaluation if the symptoms worsen or if the condition fails to improve as anticipated.    Venetia Night, MSN, APRN, FNP-BC, AGACNP-BC [facility]

## 2022-06-27 ENCOUNTER — Telehealth: Payer: Self-pay | Admitting: *Deleted

## 2022-06-27 NOTE — Telephone Encounter (Signed)
Spoke to pt, informed her that insurance denied her [place] again. Please advise.

## 2022-06-29 NOTE — Telephone Encounter (Signed)
Yes, you may have to write a letter so we can appeal.

## 2022-07-09 ENCOUNTER — Encounter: Payer: Self-pay | Admitting: Gastroenterology

## 2022-07-10 NOTE — Telephone Encounter (Signed)
Faxed letter to [facility].

## 2022-07-18 NOTE — Telephone Encounter (Signed)
Received another denial letter for Motegrity 2 mg. Informed pt,  gave her a number to call for pt assistant for [place]. Informed her to call with in update. Sent letters to scan center.

## 2022-07-25 ENCOUNTER — Other Ambulatory Visit: Payer: Self-pay | Admitting: Gastroenterology

## 2022-09-10 ENCOUNTER — Other Ambulatory Visit: Payer: Self-pay | Admitting: Gastroenterology

## 2022-10-11 ENCOUNTER — Telehealth: Payer: Self-pay | Admitting: *Deleted

## 2022-10-11 NOTE — Telephone Encounter (Signed)
Pt dropped off application for Motegrity. I faxed to company. Waiting on responds.

## 2022-10-15 ENCOUNTER — Other Ambulatory Visit: Payer: Self-pay

## 2022-10-22 ENCOUNTER — Other Ambulatory Visit: Payer: Self-pay | Admitting: Gastroenterology

## 2023-01-20 ENCOUNTER — Other Ambulatory Visit: Payer: Self-pay | Admitting: Gastroenterology

## 2023-02-18 ENCOUNTER — Other Ambulatory Visit: Payer: Self-pay | Admitting: Gastroenterology

## 2023-05-10 DIAGNOSIS — M069 Rheumatoid arthritis, unspecified: Secondary | ICD-10-CM | POA: Diagnosis not present

## 2023-05-10 DIAGNOSIS — R42 Dizziness and giddiness: Secondary | ICD-10-CM | POA: Diagnosis not present

## 2023-05-13 DIAGNOSIS — M1990 Unspecified osteoarthritis, unspecified site: Secondary | ICD-10-CM | POA: Diagnosis not present

## 2023-05-13 DIAGNOSIS — R2681 Unsteadiness on feet: Secondary | ICD-10-CM | POA: Diagnosis not present

## 2023-05-20 ENCOUNTER — Other Ambulatory Visit: Payer: Self-pay | Admitting: Gastroenterology

## 2023-05-27 DIAGNOSIS — M797 Fibromyalgia: Secondary | ICD-10-CM | POA: Diagnosis not present

## 2023-05-27 DIAGNOSIS — Z79891 Long term (current) use of opiate analgesic: Secondary | ICD-10-CM | POA: Diagnosis not present

## 2023-05-27 DIAGNOSIS — M5116 Intervertebral disc disorders with radiculopathy, lumbar region: Secondary | ICD-10-CM | POA: Diagnosis not present

## 2023-05-27 DIAGNOSIS — M5459 Other low back pain: Secondary | ICD-10-CM | POA: Diagnosis not present

## 2023-05-27 DIAGNOSIS — Z79899 Other long term (current) drug therapy: Secondary | ICD-10-CM | POA: Diagnosis not present

## 2023-05-27 DIAGNOSIS — G894 Chronic pain syndrome: Secondary | ICD-10-CM | POA: Diagnosis not present

## 2023-06-25 DIAGNOSIS — R52 Pain, unspecified: Secondary | ICD-10-CM | POA: Diagnosis not present

## 2023-06-25 DIAGNOSIS — M069 Rheumatoid arthritis, unspecified: Secondary | ICD-10-CM | POA: Diagnosis not present

## 2023-06-25 DIAGNOSIS — Z299 Encounter for prophylactic measures, unspecified: Secondary | ICD-10-CM | POA: Diagnosis not present

## 2023-06-25 DIAGNOSIS — M797 Fibromyalgia: Secondary | ICD-10-CM | POA: Diagnosis not present

## 2023-07-04 DIAGNOSIS — Z299 Encounter for prophylactic measures, unspecified: Secondary | ICD-10-CM | POA: Diagnosis not present

## 2023-07-04 DIAGNOSIS — R350 Frequency of micturition: Secondary | ICD-10-CM | POA: Diagnosis not present

## 2023-07-04 DIAGNOSIS — M069 Rheumatoid arthritis, unspecified: Secondary | ICD-10-CM | POA: Diagnosis not present

## 2023-07-04 DIAGNOSIS — R52 Pain, unspecified: Secondary | ICD-10-CM | POA: Diagnosis not present

## 2023-07-08 DIAGNOSIS — N6489 Other specified disorders of breast: Secondary | ICD-10-CM | POA: Diagnosis not present

## 2023-07-08 DIAGNOSIS — R928 Other abnormal and inconclusive findings on diagnostic imaging of breast: Secondary | ICD-10-CM | POA: Diagnosis not present

## 2023-07-30 DIAGNOSIS — Z79899 Other long term (current) drug therapy: Secondary | ICD-10-CM | POA: Diagnosis not present

## 2023-07-30 DIAGNOSIS — E7800 Pure hypercholesterolemia, unspecified: Secondary | ICD-10-CM | POA: Diagnosis not present

## 2023-07-30 DIAGNOSIS — Z713 Dietary counseling and surveillance: Secondary | ICD-10-CM | POA: Diagnosis not present

## 2023-07-30 DIAGNOSIS — Z7189 Other specified counseling: Secondary | ICD-10-CM | POA: Diagnosis not present

## 2023-07-30 DIAGNOSIS — Z5320 Procedure and treatment not carried out because of patient's decision for unspecified reasons: Secondary | ICD-10-CM | POA: Diagnosis not present

## 2023-07-30 DIAGNOSIS — Z0000 Encounter for general adult medical examination without abnormal findings: Secondary | ICD-10-CM | POA: Diagnosis not present

## 2023-07-30 DIAGNOSIS — R5383 Other fatigue: Secondary | ICD-10-CM | POA: Diagnosis not present

## 2023-07-30 DIAGNOSIS — R52 Pain, unspecified: Secondary | ICD-10-CM | POA: Diagnosis not present

## 2023-07-30 DIAGNOSIS — E039 Hypothyroidism, unspecified: Secondary | ICD-10-CM | POA: Diagnosis not present

## 2023-07-30 DIAGNOSIS — Z299 Encounter for prophylactic measures, unspecified: Secondary | ICD-10-CM | POA: Diagnosis not present

## 2023-08-01 ENCOUNTER — Other Ambulatory Visit: Payer: Self-pay | Admitting: Gastroenterology

## 2023-08-13 DIAGNOSIS — Z299 Encounter for prophylactic measures, unspecified: Secondary | ICD-10-CM | POA: Diagnosis not present

## 2023-08-13 DIAGNOSIS — D485 Neoplasm of uncertain behavior of skin: Secondary | ICD-10-CM | POA: Diagnosis not present

## 2023-08-13 DIAGNOSIS — L820 Inflamed seborrheic keratosis: Secondary | ICD-10-CM | POA: Diagnosis not present

## 2023-08-27 DIAGNOSIS — R350 Frequency of micturition: Secondary | ICD-10-CM | POA: Diagnosis not present

## 2023-08-27 DIAGNOSIS — Z713 Dietary counseling and surveillance: Secondary | ICD-10-CM | POA: Diagnosis not present

## 2023-08-27 DIAGNOSIS — Z0000 Encounter for general adult medical examination without abnormal findings: Secondary | ICD-10-CM | POA: Diagnosis not present

## 2023-08-27 DIAGNOSIS — Z299 Encounter for prophylactic measures, unspecified: Secondary | ICD-10-CM | POA: Diagnosis not present

## 2023-08-27 DIAGNOSIS — M069 Rheumatoid arthritis, unspecified: Secondary | ICD-10-CM | POA: Diagnosis not present

## 2023-09-08 ENCOUNTER — Other Ambulatory Visit: Payer: Self-pay | Admitting: Gastroenterology

## 2023-09-24 DIAGNOSIS — Z713 Dietary counseling and surveillance: Secondary | ICD-10-CM | POA: Diagnosis not present

## 2023-09-24 DIAGNOSIS — M069 Rheumatoid arthritis, unspecified: Secondary | ICD-10-CM | POA: Diagnosis not present

## 2023-09-24 DIAGNOSIS — R52 Pain, unspecified: Secondary | ICD-10-CM | POA: Diagnosis not present

## 2023-09-24 DIAGNOSIS — K649 Unspecified hemorrhoids: Secondary | ICD-10-CM | POA: Diagnosis not present

## 2023-10-15 DIAGNOSIS — M25561 Pain in right knee: Secondary | ICD-10-CM | POA: Diagnosis not present

## 2023-10-15 DIAGNOSIS — M545 Low back pain, unspecified: Secondary | ICD-10-CM | POA: Diagnosis not present

## 2023-10-15 DIAGNOSIS — I011 Acute rheumatic endocarditis: Secondary | ICD-10-CM | POA: Diagnosis not present

## 2023-10-15 DIAGNOSIS — M79672 Pain in left foot: Secondary | ICD-10-CM | POA: Diagnosis not present

## 2023-10-15 DIAGNOSIS — M0579 Rheumatoid arthritis with rheumatoid factor of multiple sites without organ or systems involvement: Secondary | ICD-10-CM | POA: Diagnosis not present

## 2023-10-15 DIAGNOSIS — M359 Systemic involvement of connective tissue, unspecified: Secondary | ICD-10-CM | POA: Diagnosis not present

## 2023-10-15 DIAGNOSIS — M797 Fibromyalgia: Secondary | ICD-10-CM | POA: Diagnosis not present

## 2023-10-15 DIAGNOSIS — E039 Hypothyroidism, unspecified: Secondary | ICD-10-CM | POA: Diagnosis not present

## 2023-10-15 DIAGNOSIS — M1990 Unspecified osteoarthritis, unspecified site: Secondary | ICD-10-CM | POA: Diagnosis not present

## 2023-10-15 DIAGNOSIS — M79671 Pain in right foot: Secondary | ICD-10-CM | POA: Diagnosis not present

## 2023-10-15 DIAGNOSIS — M25562 Pain in left knee: Secondary | ICD-10-CM | POA: Diagnosis not present

## 2023-10-24 DIAGNOSIS — K649 Unspecified hemorrhoids: Secondary | ICD-10-CM | POA: Diagnosis not present

## 2023-10-28 DIAGNOSIS — E079 Disorder of thyroid, unspecified: Secondary | ICD-10-CM | POA: Diagnosis not present

## 2023-10-28 DIAGNOSIS — R58 Hemorrhage, not elsewhere classified: Secondary | ICD-10-CM | POA: Diagnosis not present

## 2023-10-28 DIAGNOSIS — R109 Unspecified abdominal pain: Secondary | ICD-10-CM | POA: Diagnosis not present

## 2023-10-28 DIAGNOSIS — Z7982 Long term (current) use of aspirin: Secondary | ICD-10-CM | POA: Diagnosis not present

## 2023-10-28 DIAGNOSIS — R410 Disorientation, unspecified: Secondary | ICD-10-CM | POA: Diagnosis not present

## 2023-10-28 DIAGNOSIS — R300 Dysuria: Secondary | ICD-10-CM | POA: Diagnosis not present

## 2023-10-28 DIAGNOSIS — Z79899 Other long term (current) drug therapy: Secondary | ICD-10-CM | POA: Diagnosis not present

## 2023-10-28 DIAGNOSIS — N390 Urinary tract infection, site not specified: Secondary | ICD-10-CM | POA: Diagnosis not present

## 2023-10-28 DIAGNOSIS — M797 Fibromyalgia: Secondary | ICD-10-CM | POA: Diagnosis not present

## 2023-11-04 ENCOUNTER — Telehealth: Payer: Self-pay | Admitting: *Deleted

## 2023-11-04 ENCOUNTER — Telehealth: Admitting: Gastroenterology

## 2023-11-04 NOTE — Telephone Encounter (Signed)
 Noted

## 2023-11-04 NOTE — Telephone Encounter (Signed)
 Pt called and states she is having problems with hemorrhoids and would like for provider to send in nitroglycerin cream for her hemorrhoid. I informed her that she would need an OV before someone would be able to send anything in for her. I offered her tomorrow with Dr. Shaaron or Thursday with Charmaine Melia, NP and she refused. Stating she has other appointments. Pt has cancelled several OV that she has had or either no showed.

## 2023-11-08 DIAGNOSIS — M069 Rheumatoid arthritis, unspecified: Secondary | ICD-10-CM | POA: Diagnosis not present

## 2023-11-08 DIAGNOSIS — R531 Weakness: Secondary | ICD-10-CM | POA: Diagnosis not present

## 2023-11-08 DIAGNOSIS — Z713 Dietary counseling and surveillance: Secondary | ICD-10-CM | POA: Diagnosis not present

## 2023-11-14 DIAGNOSIS — N3010 Interstitial cystitis (chronic) without hematuria: Secondary | ICD-10-CM | POA: Diagnosis not present

## 2023-12-04 ENCOUNTER — Other Ambulatory Visit (INDEPENDENT_AMBULATORY_CARE_PROVIDER_SITE_OTHER): Payer: Self-pay | Admitting: Gastroenterology

## 2023-12-04 ENCOUNTER — Telehealth: Payer: Self-pay | Admitting: *Deleted

## 2023-12-04 NOTE — Telephone Encounter (Signed)
 Routing to you in absence of Charmaine Melia, NP. Pt called and would like a prescription of Motegrity  sent to her pharmacy. Pt last OV 06/25/2022

## 2023-12-04 NOTE — Telephone Encounter (Signed)
 Noted. Transferred pt to the front to make an OV

## 2023-12-31 DIAGNOSIS — Z299 Encounter for prophylactic measures, unspecified: Secondary | ICD-10-CM | POA: Diagnosis not present

## 2023-12-31 DIAGNOSIS — G4700 Insomnia, unspecified: Secondary | ICD-10-CM | POA: Diagnosis not present

## 2023-12-31 DIAGNOSIS — M069 Rheumatoid arthritis, unspecified: Secondary | ICD-10-CM | POA: Diagnosis not present

## 2023-12-31 DIAGNOSIS — R52 Pain, unspecified: Secondary | ICD-10-CM | POA: Diagnosis not present

## 2023-12-31 DIAGNOSIS — Z79899 Other long term (current) drug therapy: Secondary | ICD-10-CM | POA: Diagnosis not present

## 2023-12-31 DIAGNOSIS — M797 Fibromyalgia: Secondary | ICD-10-CM | POA: Diagnosis not present

## 2024-01-16 ENCOUNTER — Ambulatory Visit (INDEPENDENT_AMBULATORY_CARE_PROVIDER_SITE_OTHER): Admitting: Gastroenterology

## 2024-01-16 ENCOUNTER — Encounter: Payer: Self-pay | Admitting: Gastroenterology

## 2024-01-16 VITALS — BP 118/80 | HR 66 | Temp 97.5°F | Ht 65.0 in | Wt 207.6 lb

## 2024-01-16 DIAGNOSIS — K219 Gastro-esophageal reflux disease without esophagitis: Secondary | ICD-10-CM | POA: Diagnosis not present

## 2024-01-16 DIAGNOSIS — K581 Irritable bowel syndrome with constipation: Secondary | ICD-10-CM | POA: Diagnosis not present

## 2024-01-16 DIAGNOSIS — R1310 Dysphagia, unspecified: Secondary | ICD-10-CM

## 2024-01-16 DIAGNOSIS — Z862 Personal history of diseases of the blood and blood-forming organs and certain disorders involving the immune mechanism: Secondary | ICD-10-CM

## 2024-01-16 DIAGNOSIS — K6289 Other specified diseases of anus and rectum: Secondary | ICD-10-CM | POA: Diagnosis not present

## 2024-01-16 DIAGNOSIS — K602 Anal fissure, unspecified: Secondary | ICD-10-CM

## 2024-01-16 DIAGNOSIS — K648 Other hemorrhoids: Secondary | ICD-10-CM

## 2024-01-16 DIAGNOSIS — R110 Nausea: Secondary | ICD-10-CM | POA: Diagnosis not present

## 2024-01-16 DIAGNOSIS — K5909 Other constipation: Secondary | ICD-10-CM

## 2024-01-16 DIAGNOSIS — K641 Second degree hemorrhoids: Secondary | ICD-10-CM

## 2024-01-16 DIAGNOSIS — D509 Iron deficiency anemia, unspecified: Secondary | ICD-10-CM

## 2024-01-16 DIAGNOSIS — K5903 Drug induced constipation: Secondary | ICD-10-CM

## 2024-01-16 MED ORDER — PRUCALOPRIDE SUCCINATE 2 MG PO TABS: 2.0000 mg | ORAL_TABLET | Freq: Every day | ORAL | 11 refills | Status: DC

## 2024-01-16 MED ORDER — NITROGLYCERIN 0.4 % RE OINT: TOPICAL_OINTMENT | RECTAL | 0 refills | Status: AC

## 2024-01-16 MED ORDER — PROMETHAZINE HCL 25 MG RE SUPP: RECTAL | 1 refills | Status: DC

## 2024-01-16 MED ORDER — LINACLOTIDE 290 MCG PO CAPS: 290.0000 ug | ORAL_CAPSULE | Freq: Every day | ORAL | 11 refills | Status: AC

## 2024-01-16 MED ORDER — PROMETHAZINE HCL 25 MG PO TABS: 25.0000 mg | ORAL_TABLET | Freq: Three times a day (TID) | ORAL | 11 refills | Status: DC | PRN

## 2024-01-16 NOTE — Patient Instructions (Signed)
 Trial nitroglycerin  ointment inserted into rectum twice daily for 2 weeks at the onset of rectal pain.  Continue to try to avoid straining and adequate control of constipation.  Continue your stool softener daily and fiber 3 times daily.  Continue Dulcolax every third day.  Continue Linzess  daily and Motegrity  every 2-3 days for management of constipation.  Consider hemorrhoid banding if no improvement with nitroglycerin  ointment.  You should monitor for dizziness, lightheadedness, and headaches with this.  Please stop if you experience the symptoms.  I attached some written education about the medication for you to the back of your paperwork.  Continue taking your iron daily for your history of iron deficiency anemia.  Reassuringly your most recent labs showed a stable hemoglobin.  Continue omeprazole  40 mg twice daily.  Follow a GERD diet:  Avoid fried, fatty, greasy, spicy, citrus foods. Avoid caffeine and carbonated beverages. Avoid chocolate. Try eating 4-6 small meals a day rather than 3 large meals. Do not eat within 3 hours of laying down. Prop head of bed up on wood or bricks to create a 6 inch incline.  We could consider trial of Voquezna in the future, please read over the medication and if you would like to give it a try please let me know.  We will get you scheduled for upper endoscopy in the near future with possible dilation with Dr. Shaaron.  If you decide you would like to consider going with the TIF procedure please let me know and you can schedule a telephone visit with Dr. Elin needed to discuss it further.  It was a pleasure to see you today. I want to create trusting relationships with patients. If you receive a survey regarding your visit,  I greatly appreciate you taking time to fill this out on paper or through your MyChart. I value your feedback.  Charmaine Melia, MSN, FNP-BC, AGACNP-BC [facility]

## 2024-01-16 NOTE — Progress Notes (Addendum)
 GI Office Note    Referring Provider: Maree Isles, MD Primary Care Physician:  Maree Isles, MD Primary Gastroenterologist: Lamar HERO.Rourk, MD  Date:  01/16/2024  ID:  Shirley Perry, DOB 08-14-1975, MRN [id number]   Chief Complaint   Chief Complaint  Patient presents with   Refills     Medication refills and thinks she has hemorrhoids    History of Present Illness  Shirley Perry is a 48 y.o. female with a history of asthma, OSA on CPAP, overactive bladder, IDA, IBS-C, hemorrhoids, hypothyroidism, PCOS, interstitial cystitis, anxiety and depression presenting today with need for medication refills and discuss hemorrhoids.  Colonoscopy November 2021: - Redundant and elongated colon - prominent anal papilla.  - Due in 2031.   EGD November 2021  - Normal   OV in person 06/14/21. Taking omeprazole  BID. On Linzess  290 mcg daily, simproic daily, and colace daily. Taking laxatives every 3 days. BM twice per week. Spends 30 minutes or more on commode. Nausea improved from prior with phenergan  suppositories and tablets. On iron supplementation. Declined givens in the past. Trial motegrity  advised. Add miralax to current regimen.    Multiple telephone calls to the office over the last year. Multiple changes and trials regarding constipation regimen. Most recently attempted to resubmit for motegrity .    Labs 06/13/2022: Hemoglobin 13.6, MCV 85.4, platelets 125, iron 38, iron sat 13%, ferritin 29.3, normal CMP.  Last visit virtually 06/25/22.  Symptoms of significant constipation and requesting to try Motegrity  given her insurance had changed.  Has not had good results with Trulance  or Amitiza  in the past.  Having a bowel movement every 3 days or day after taking bisacodyl.  Symptoms include back pain and notes a history of fibromyalgia, arthritis, and  other chronic pain.  Remains on Lyrica  and takes oxycodone 10 mg every 6 hours, sometimes up to the 4 times per day.  Has occasional abdominal  pain with nausea.  Taking iron once daily as well as fiber supplementation 3 times daily.  Had tried MiraLAX in the past but also felt like it was not very beneficial.    Today:  Discussed the use of AI scribe software for clinical note transcription with the patient, who gave verbal consent to proceed.   She experiences worsening nausea, particularly on days when she takes Motegrity , even with food, which prevents her from taking it daily. Without Motegrity , bowel movements occur every three days. She takes Linzess  daily and Motegrity  every third day, coinciding with her bowel movements. Her regimen includes fiber three times daily, a stool softener twice daily with an extra dose on the third day, and bisacodyl every three days. For nausea, she uses promethazine  as needed but avoids it due to drowsiness.  Does continue to have issues with straining.  Denies any melena or BRBPR.  Hemorrhoid symptoms include a sensation of something protruding, about the size of a nickel or quarter, which retracts on its own. She reports pain and occasional itching, and sometimes sits for long periods and sometimes lifts heavy things. She has a history of hemorrhoid banding. No bleeding is reported, but significant pain is described as both stabbing and crampy.  She experiences occasional trouble swallowing, occurring a few times a month, but not recently. She is unsure if it is related to solids or liquids. She has a history of reflux, fairly well controlled on omeprazole  but may be interested in trying Voquezna. She has not had an endoscopy since 2021, which was normal  at that time.  She continues to have menstrual cycles, though not monthly, with the last one occurring in July. She has not had a hysterectomy.   Wt Readings from Last 5 Encounters:  01/16/24 207 lb 9.6 oz (94.2 kg)  06/25/22 180 lb (81.6 kg)  06/14/21 183 lb 6.4 oz (83.2 kg)  12/15/20 188 lb (85.3 kg)  06/15/20 198 lb 12.8 oz (90.2 kg)     Current Outpatient Medications  Medication Sig Dispense Refill   aspirin EC 81 MG tablet Take 81 mg by mouth at bedtime.      baclofen (LIORESAL) 10 MG tablet Take 10 mg by mouth as needed.     busPIRone (BUSPAR) 10 MG tablet Take 10 mg by mouth 3 (three) times daily.     traZODone (DESYREL) 50 MG tablet Take 50 mg by mouth at bedtime.     bisacodyl (DULCOLAX) 5 MG EC tablet Take 25 mg by mouth every 3 (three) days.     busPIRone (BUSPAR) 5 MG tablet Take by mouth.     celecoxib (CELEBREX) 200 MG capsule Take 200 mg by mouth 2 (two) times daily as needed.     Docusate Sodium (DSS) 100 MG CAPS Take 1 capsule by mouth daily.     FEROSUL 325 (65 Fe) MG tablet Take 325 mg by mouth at bedtime.     Fiber POWD Take 10 mLs by mouth 3 (three) times daily.     hydrOXYzine (ATARAX) 25 MG tablet Take 25 mg by mouth at bedtime.     ibuprofen (ADVIL) 800 MG tablet Take 800 mg by mouth as needed.     LATUDA 80 MG TABS tablet Take 80 mg by mouth at bedtime.   1   levothyroxine  (SYNTHROID ) 50 MCG tablet Take 50 mcg by mouth every morning.     lidocaine  (LIDODERM ) 5 % SMARTSIG:Topical     LINZESS  290 MCG CAPS capsule TAKE ONE CAPSULE BY MOUTH EVERY DAY BEFORE BREAKFAST 30 capsule 11   meclizine (ANTIVERT) 12.5 MG tablet Take by mouth.     meloxicam  (MOBIC ) 7.5 MG tablet Take by mouth.     methylPREDNISolone  (MEDROL  DOSEPAK) 4 MG TBPK tablet Take 4 mg by mouth See admin instructions. Once a month as needed     MYRBETRIQ 50 MG TB24 tablet Take 50 mg by mouth daily.     naloxone (NARCAN) nasal spray 4 mg/0.1 mL accidental overdose CALL 911. ADMINISTER A SINGLE SPRAY OF NARCAN IN ONE NOSTRIL, REPEAT EVERY 3 MINUTES AS NEEDED IF NO OR MINIMAL RESPONSE     NYAMYC  powder Apply topically 2 (two) times daily as needed.     omeprazole  (PRILOSEC) 40 MG capsule TAKE ONE CAPSULE BY MOUTH TWICE DAILY 60 capsule 11   Oxycodone HCl 10 MG TABS      pentosan polysulfate (ELMIRON) 100 MG capsule Take 100 mg by mouth 3  (three) times daily before meals.     potassium chloride (MICRO-K) 10 MEQ CR capsule Take 10 mEq by mouth daily.     pregabalin  (LYRICA ) 200 MG capsule Take 200 mg by mouth 3 (three) times daily.     promethazine  (PHENERGAN ) 25 MG suppository INSERT 1 SUPPOSITORY RECTALLY EVERY 6 HOURS AS NEEDED FOR NAUSEA AND VOMITING 30 suppository 1   promethazine  (PHENERGAN ) 25 MG tablet TAKE 1 TABLET BY MOUTH EVERY 8 HOURS AS NEEDED FOR NAUSEA AND VOMITING 20 tablet 11   Prucalopride Succinate  (MOTEGRITY ) 2 MG TABS Take 1 tablet (2 mg total) by mouth  daily. 30 tablet 2   sertraline (ZOLOFT) 50 MG tablet Take 50 mg by mouth at bedtime.     tranexamic acid (LYSTEDA) 650 MG TABS tablet Take 1,300 mg by mouth 3 (three) times daily.     VENTOLIN  HFA 108 (90 Base) MCG/ACT inhaler SMARTSIG:1-2 Puff(s) By Mouth Every 6 Hours PRN     No current facility-administered medications for this visit.    Past Medical History:  Diagnosis Date   Allergic rhinitis    Anemia    Asthma    Chronic constipation    Depression    GERD (gastroesophageal reflux disease)    Hiatal hernia    Hypothyroidism    Interstitial cystitis    Irritable bowel syndrome    Obesity    OSA on CPAP    Overactive bladder    Paranoia ([facility])    Psoriasis     Past Surgical History:  Procedure Laterality Date   BLADDER REPAIR     CHOLECYSTECTOMY  1999   COLONOSCOPY  2011   single anal papilla   COLONOSCOPY WITH PROPOFOL  N/A 03/03/2020   Redundant colon, normal-appearing TI and colonic mucosa, prominent anal papilla.  Repeat colonoscopy in 10 years.   ESOPHAGOGASTRODUODENOSCOPY  2006   patulous EGJ, small hh   ESOPHAGOGASTRODUODENOSCOPY  11/10/2010   [id number] esophagus/patulous EG junction/small HH   ESOPHAGOGASTRODUODENOSCOPY (EGD) WITH PROPOFOL  N/A 03/03/2020   Normal examined esophagus, stomach, and duodenum.   TUBAL LIGATION Bilateral     Family History  Problem Relation Age of Onset   Ulcers Mother    Breast cancer  Mother    Diabetes Father    Sleep apnea Sister    Anesthesia problems Neg Hx    Hypotension Neg Hx    Malignant hyperthermia Neg Hx    Pseudochol deficiency Neg Hx    Colon cancer Neg Hx     Allergies as of 01/16/2024 - Review Complete 01/16/2024  Allergen Reaction Noted   Amitiza  [lubiprostone ]  02/08/2020   Other Other (See Comments) 09/11/2019    Social History   Socioeconomic History   Marital status: Divorced    Spouse name: Not on file   Number of children: 2   Years of education: College   Highest education level: Not on file  Occupational History   Occupation: unemployed    Associate Professor: UNEMPLOYED    Employer: NOT EMPLOYED  Tobacco Use   Smoking status: Never   Smokeless tobacco: Never  Substance and Sexual Activity   Alcohol use: No   Drug use: No   Sexual activity: Never  Other Topics Concern   Not on file  Social History Narrative   Patient lives at home with her daughter.   Caffeine Use: some   Social Drivers of Corporate investment banker Strain: Low Risk  (02/13/2022)   Received from [facility]   Overall Financial Resource Strain (CARDIA)    Difficulty of Paying Living Expenses: Not hard at all  Food Insecurity: No Food Insecurity (11/19/2023)   Received from [facility]   Hunger Vital Sign    Within the past 12 months, you worried that your food would run out before you got the money to buy more.: Never true    Within the past 12 months, the food you bought just didn't last and you didn't have money to get more.: Never true  Transportation Needs: Unmet Transportation Needs (11/19/2023)   Received from [facility] - Transportation  Lack of Transportation (Medical): Yes    Lack of Transportation (Non-Medical): Yes  Physical Activity: Inactive (02/13/2022)   Received from [facility]   Exercise Vital Sign    On average, how many days per week do you engage in moderate to strenuous exercise (like a brisk walk)?: 0 days     On average, how many minutes do you engage in exercise at this level?: 0 min  Stress: Stress Concern Present (02/13/2022)   Received from [facility] of Occupational Health - Occupational Stress Questionnaire    Feeling of Stress : To some extent  Social Connections: Socially Isolated (02/13/2022)   Received from [facility]   Social Connection and Isolation Panel    In a typical week, how many times do you talk on the phone with family, friends, or neighbors?: More than three times a week    How often do you get together with friends or relatives?: More than three times a week    How often do you attend church or religious services?: Never    Do you belong to any clubs or organizations such as church groups, unions, fraternal or athletic groups, or school groups?: No    How often do you attend meetings of the clubs or organizations you belong to?: Never    Are you married, widowed, divorced, separated, never married, or living with a partner?: Divorced     Review of Systems   Gen: Denies fever, chills, anorexia. Denies fatigue, weakness, weight loss.  CV: Denies chest pain, palpitations, syncope, peripheral edema, and claudication. Resp: Denies dyspnea at rest, cough, wheezing, coughing up blood, and pleurisy. GI: See HPI Derm: Denies rash, itching, dry skin Psych: Denies depression, anxiety, memory loss, confusion. No homicidal or suicidal ideation.  Heme: Denies bruising, bleeding, and enlarged lymph nodes.  Physical Exam   BP 118/80 (BP Location: Right Arm, Patient Position: Sitting, Cuff Size: Normal)   Pulse 66   Temp (!) 97.5 F (36.4 C) (Temporal)   Ht 5' 5 (1.651 m)   Wt 207 lb 9.6 oz (94.2 kg)   LMP 11/05/2023   BMI 34.55 kg/m   General:   Alert and oriented. No distress noted. Pleasant and cooperative.  Head:  Normocephalic and atraumatic. Eyes:  Conjuctiva clear without scleral icterus. Mouth:  UTA - mask in place.  Abdomen:   non-distended Rectal: Mild palpable hemorrhoid tissue in the right posterior and right anterior column, no significant palpable tissue in the left lateral column, no external hemorrhoids, good rectal tone without evidence of rectal mass.  Mild pain and sharp sensation with examination.  No obvious anal fissure palpated or visualized. Msk:  Symmetrical without gross deformities. Normal posture. Extremities:  Without edema. Neurologic:  Alert and  oriented x4 Psych:  Alert and cooperative. Normal mood and affect.  Assessment  Shirley Perry is a 48 y.o. female presenting today  to discuss hemorrhoids and receive medication refills for constipation.    Chronic constipation with associated nausea Nausea worsens with Motegrity , even when taken with food. Bowel movements occur every three days with Motegrity  and Linzess . No significant improvement in abdominal pain with daily medication use. Promethazine  used for nausea but causes drowsiness. Reglan not recommended due to potential interaction with trazodone and risk of extrapyramidal symptoms. Will need to rule out H. Pylori as cause of nausea.  - Prescribe Motegrity  2 mg daily.  - Refill Linzess  290 mcg daily prescription - Continue fiber, stool softener,  and bisacodyl regimen - Order upper endoscopy to evaluate worsening nausea  Hemorrhoids with possible anal fissure Mild palpable hemorrhoid tissue in the right posterior and right anterior column. No external hemorrhoids noted. Possible mild anal fissure due to straining. Symptoms include pain, itching, and pressure, exacerbated by sitting and lifting heavy objects. - Prescribe nitroglycerin  ointment for possible anal fissure - Instruct to apply nitroglycerin  ointment internally twice daily for two weeks - Consider repeat hemorrhoid banding if symptoms persist  Gastroesophageal reflux disease with intermittent dysphagia Dysphagia occurs a few times a month, not recently. Symptoms include  burning sensation and occasional trouble swallowing. Discussed TIF procedure as a potential treatment option to reduce reflux and possibly discontinue medication. Voquezna considered as an alternative to omeprazole . Explained that TIF is done endoscopically and may help prevent reflux by creating a new sphincter-like structure. Discussed potential for reduced medication use post-procedure, but some may still require medication depending on reflux etiology. Need to rule out H. Pylori.  Discussed need for BPe and manometry for work up.  - Order upper endoscopy with possible dilation to evaluate dysphagia and reflux - Discuss TIF procedure with Dr. Eartha via telephone consultation if requested - Consider switching to Voquezna after reviewing information - GERD diet  Proceed with upper endoscopy with dilation with propofol  by Dr. Shaaron in near future: the risks, benefits, and alternatives have been discussed with the patient in detail. The patient states understanding and desires to proceed. ASA 3  UPT pre-op  History of iron deficiency anemia No current symptoms of melena or bright red blood per rectum. Recent labs show normal hemoglobin and iron levels. - Continue daily iron supplementation      PLAN    Follow up 3 months.     Charmaine Melia, MSN, FNP-BC, AGACNP-BC [facility]

## 2024-01-22 ENCOUNTER — Other Ambulatory Visit: Payer: Self-pay | Admitting: Gastroenterology

## 2024-01-22 DIAGNOSIS — K581 Irritable bowel syndrome with constipation: Secondary | ICD-10-CM

## 2024-01-22 MED ORDER — MOTEGRITY 2 MG PO TABS: 2.0000 mg | ORAL_TABLET | Freq: Every day | ORAL | 3 refills | Status: AC

## 2024-01-28 ENCOUNTER — Telehealth: Payer: Self-pay | Admitting: *Deleted

## 2024-01-28 NOTE — Telephone Encounter (Signed)
 Called to schedule pt for procedure. She states  I have decided not to do it. FYI

## 2024-02-04 DIAGNOSIS — Z299 Encounter for prophylactic measures, unspecified: Secondary | ICD-10-CM | POA: Diagnosis not present

## 2024-02-04 DIAGNOSIS — B351 Tinea unguium: Secondary | ICD-10-CM | POA: Diagnosis not present

## 2024-02-04 DIAGNOSIS — M797 Fibromyalgia: Secondary | ICD-10-CM | POA: Diagnosis not present

## 2024-02-04 DIAGNOSIS — L84 Corns and callosities: Secondary | ICD-10-CM | POA: Diagnosis not present

## 2024-02-10 DIAGNOSIS — Z299 Encounter for prophylactic measures, unspecified: Secondary | ICD-10-CM | POA: Diagnosis not present

## 2024-02-10 DIAGNOSIS — G4700 Insomnia, unspecified: Secondary | ICD-10-CM | POA: Diagnosis not present

## 2024-02-10 DIAGNOSIS — R52 Pain, unspecified: Secondary | ICD-10-CM | POA: Diagnosis not present

## 2024-02-10 DIAGNOSIS — M069 Rheumatoid arthritis, unspecified: Secondary | ICD-10-CM | POA: Diagnosis not present

## 2024-02-10 DIAGNOSIS — L84 Corns and callosities: Secondary | ICD-10-CM | POA: Diagnosis not present

## 2024-04-06 NOTE — Progress Notes (Unsigned)
 GI Office Note    Referring Provider: Maree Isles, MD Primary Care Physician:  Rosamond Leta NOVAK, MD Primary Gastroenterologist: Lamar HERO.Rourk, MD  Date:  04/07/2024  ID:  MARGALIT LEECE, DOB 22-Apr-1976, MRN [id number]  Chief Complaint   Chief Complaint  Patient presents with   Follow-up    Follow up. Hemorrhoids    History of Present Illness  ANIS CINELLI is a 48 y.o. female with a history of asthma, OSA on CPAP, overactive bladder, IDA, IBS-C, hemorrhoids, hypothyroidism, PCOS, interstitial cystitis, anxiety and depression presenting today to discuss her hemorrhoids.  Colonoscopy November 2021: - Redundant and elongated colon - prominent anal papilla.  - Due in 2031.   EGD November 2021  - Normal   OV in person 06/14/21. Taking omeprazole  BID. On Linzess  290 mcg daily, simproic daily, and colace daily. Taking laxatives every 3 days. BM twice per week. Spends 30 minutes or more on commode. Nausea improved from prior with phenergan  suppositories and tablets. On iron supplementation. Declined givens in the past. Trial motegrity  advised. Add miralax to current regimen.    Multiple telephone calls to the office over the last year. Multiple changes and trials regarding constipation regimen. Most recently attempted to resubmit for motegrity .    Labs 06/13/2022: Hemoglobin 13.6, MCV 85.4, platelets 125, iron 38, iron sat 13%, ferritin 29.3, normal CMP.   VV 06/25/22.  Symptoms of significant constipation and requesting to try Motegrity  given her insurance had changed.  Has not had good results with Trulance  or Amitiza  in the past.  Having a bowel movement every 3 days or day after taking bisacodyl.  Symptoms include back pain and notes a history of fibromyalgia, arthritis, and  other chronic pain.  Remains on Lyrica  and takes oxycodone 10 mg every 6 hours, sometimes up to the 4 times per day.  Has occasional abdominal pain with nausea.  Taking iron once daily as well as fiber  supplementation 3 times daily.  Had tried MiraLAX in the past but also felt like it was not very beneficial.    Last office visit 01/16/24.  Having nausea on days she takes Motegrity  even if she takes with food which is why she does not take it every day.  When not taking Motegrity  she only has BM every 3 days.  Takes Linzess  daily but the Motegrity  every third day and also does her fiber, stool softener, and bisacodyl.  Using Phenergan  for nausea only as needed given drowsiness.  Having symptomatic hemorrhoids with occasional itching and pain.  Also reported some dysphagia a few times a month in the past but unsure if solids or liquids are both.  Reflux fairly well-controlled on omeprazole  but interested in trying different medication. Scheduled EGD with dilation. Consider switch to voquezna. Advised nitroglycerin  cream to the affected area for 2 weeks. Given motegrity  2 mg, linzess  290 mcg and advised ongoing fiber, stool softener, and bisacodyl regimen. Advised to continue iron supplementation.   Today:  Discussed the use of AI scribe software for clinical note transcription with the patient, who gave verbal consent to proceed.  She experiences ongoing hemorrhoid symptoms, primarily pain and occasional itching, with difficulty in bowel movements and some bleeding, though not prolonged.  Previously, she used nitroglycerin , which provided some relief but did not alleviate swelling. She has not used over-the-counter hydrocortisone  in conjunction with nitroglycerin . Compounded medications are not covered by her insurance as thes have been offered to her in the past. Has occasional bleeding but not frequently.  She is currently taking Linzess  290 mcg and Motegrity  for bowel management, along with 300 mg stool softeners daily and three 5 mg Bisacodyl every third day. This regimen has improved her bowel movements without causing excessive looseness or nausea.       Wt Readings from Last 6 Encounters:   04/07/24 211 lb (95.7 kg)  01/16/24 207 lb 9.6 oz (94.2 kg)  06/25/22 180 lb (81.6 kg)  06/14/21 183 lb 6.4 oz (83.2 kg)  12/15/20 188 lb (85.3 kg)  06/15/20 198 lb 12.8 oz (90.2 kg)   Body mass index is 35.11 kg/m.  Current Outpatient Medications  Medication Sig Dispense Refill   aspirin EC 81 MG tablet Take 81 mg by mouth at bedtime.      baclofen (LIORESAL) 10 MG tablet Take 10 mg by mouth as needed.     bisacodyl (DULCOLAX) 5 MG EC tablet Take 25 mg by mouth every 3 (three) days.     busPIRone (BUSPAR) 10 MG tablet Take 10 mg by mouth 3 (three) times daily.     celecoxib (CELEBREX) 200 MG capsule Take 200 mg by mouth 2 (two) times daily as needed.     Docusate Sodium (DSS) 100 MG CAPS Take 1 capsule by mouth daily.     FEROSUL 325 (65 Fe) MG tablet Take 325 mg by mouth at bedtime.     Fiber POWD Take 10 mLs by mouth 3 (three) times daily.     hydrOXYzine (ATARAX) 25 MG tablet Take 25 mg by mouth at bedtime.     ibuprofen (ADVIL) 800 MG tablet Take 800 mg by mouth as needed.     LATUDA 80 MG TABS tablet Take 80 mg by mouth at bedtime.   1   levothyroxine  (SYNTHROID ) 50 MCG tablet Take 50 mcg by mouth every morning.     lidocaine  (LIDODERM ) 5 % SMARTSIG:Topical     linaclotide  (LINZESS ) 290 MCG CAPS capsule Take 1 capsule (290 mcg total) by mouth daily before breakfast. 30 capsule 11   meclizine (ANTIVERT) 12.5 MG tablet Take by mouth.     medroxyPROGESTERone (PROVERA) 10 MG tablet take 1 tablet by oral route  every day for 7days     meloxicam  (MOBIC ) 7.5 MG tablet Take by mouth.     methylPREDNISolone  (MEDROL  DOSEPAK) 4 MG TBPK tablet Take 4 mg by mouth See admin instructions. Once a month as needed     MOTEGRITY  2 MG TABS Take 1 tablet (2 mg total) by mouth daily. 90 tablet 3   MYRBETRIQ 50 MG TB24 tablet Take 50 mg by mouth daily.     naloxone (NARCAN) nasal spray 4 mg/0.1 mL accidental overdose CALL 911. ADMINISTER A SINGLE SPRAY OF NARCAN IN ONE NOSTRIL, REPEAT EVERY 3  MINUTES AS NEEDED IF NO OR MINIMAL RESPONSE     Nitroglycerin  0.4 % OINT Use pea size amount twice daily into the rectum for 2 weeks once rectal pain begins. Stop after 2 weeks. 30 g 0   NYAMYC  powder Apply topically 2 (two) times daily as needed.     omeprazole  (PRILOSEC) 40 MG capsule TAKE ONE CAPSULE BY MOUTH TWICE DAILY 60 capsule 11   pentosan polysulfate (ELMIRON) 100 MG capsule Take 100 mg by mouth 3 (three) times daily before meals.     potassium chloride (MICRO-K) 10 MEQ CR capsule Take 10 mEq by mouth daily.     pregabalin  (LYRICA ) 200 MG capsule Take 200 mg by mouth 3 (three) times daily.  promethazine  (PHENERGAN ) 25 MG suppository INSERT 1 SUPPOSITORY RECTALLY EVERY 6 HOURS AS NEEDED FOR NAUSEA AND VOMITING 30 suppository 1   promethazine  (PHENERGAN ) 25 MG tablet Take 1 tablet (25 mg total) by mouth every 8 (eight) hours as needed for nausea or vomiting. 20 tablet 11   sertraline (ZOLOFT) 50 MG tablet Take 50 mg by mouth at bedtime. (Patient taking differently: Take 100 mg by mouth at bedtime.)     tranexamic acid (LYSTEDA) 650 MG TABS tablet Take 1,300 mg by mouth 3 (three) times daily.     traZODone (DESYREL) 50 MG tablet Take 50 mg by mouth at bedtime.     VENTOLIN  HFA 108 (90 Base) MCG/ACT inhaler SMARTSIG:1-2 Puff(s) By Mouth Every 6 Hours PRN     busPIRone (BUSPAR) 5 MG tablet Take by mouth. (Patient not taking: Reported on 04/07/2024)     Oxycodone HCl 10 MG TABS  (Patient not taking: Reported on 04/07/2024)     No current facility-administered medications for this visit.    Past Medical History:  Diagnosis Date   Allergic rhinitis    Anemia    Asthma    Chronic constipation    Depression    GERD (gastroesophageal reflux disease)    Hiatal hernia    Hypothyroidism    Interstitial cystitis    Irritable bowel syndrome    Obesity    OSA on CPAP    Overactive bladder    Paranoia (HCC)    Psoriasis     Past Surgical History:  Procedure Laterality Date    BLADDER REPAIR     CHOLECYSTECTOMY  1999   COLONOSCOPY  2011   single anal papilla   COLONOSCOPY WITH PROPOFOL  N/A 03/03/2020   Redundant colon, normal-appearing TI and colonic mucosa, prominent anal papilla.  Repeat colonoscopy in 10 years.   ESOPHAGOGASTRODUODENOSCOPY  2006   patulous EGJ, small hh   ESOPHAGOGASTRODUODENOSCOPY  11/10/2010   MFM:wnmfjo esophagus/patulous EG junction/small HH   ESOPHAGOGASTRODUODENOSCOPY (EGD) WITH PROPOFOL  N/A 03/03/2020   Normal examined esophagus, stomach, and duodenum.   TUBAL LIGATION Bilateral     Family History  Problem Relation Age of Onset   Ulcers Mother    Breast cancer Mother    Diabetes Father    Sleep apnea Sister    Anesthesia problems Neg Hx    Hypotension Neg Hx    Malignant hyperthermia Neg Hx    Pseudochol deficiency Neg Hx    Colon cancer Neg Hx     Allergies as of 04/07/2024 - Review Complete 04/07/2024  Allergen Reaction Noted   Amitiza  [lubiprostone ]  02/08/2020   Other Other (See Comments) 09/11/2019    Social History   Socioeconomic History   Marital status: Divorced    Spouse name: Not on file   Number of children: 2   Years of education: College   Highest education level: Not on file  Occupational History   Occupation: unemployed    Associate Professor: UNEMPLOYED    Employer: NOT EMPLOYED  Tobacco Use   Smoking status: Never   Smokeless tobacco: Never  Substance and Sexual Activity   Alcohol use: No   Drug use: No   Sexual activity: Never  Other Topics Concern   Not on file  Social History Narrative   Patient lives at home with her daughter.   Caffeine Use: some   Social Drivers of Health   Tobacco Use: Low Risk (04/07/2024)   Patient History    Smoking Tobacco Use: Never    Smokeless  Tobacco Use: Never    Passive Exposure: Not on file  Financial Resource Strain: Low Risk (02/13/2022)   Received from [facility]   Overall Financial Resource Strain (CARDIA)    Difficulty of Paying Living  Expenses: Not hard at all  Food Insecurity: No Food Insecurity (11/19/2023)   Received from [facility]   Epic    Within the past 12 months, you worried that your food would run out before you got the money to buy more.: Never true    Within the past 12 months, the food you bought just didn't last and you didn't have money to get more.: Never true  Transportation Needs: Unmet Transportation Needs (11/19/2023)   Received from [facility] - Transportation    Lack of Transportation (Medical): Yes    Lack of Transportation (Non-Medical): Yes  Physical Activity: Inactive (02/13/2022)   Received from [facility]   Exercise Vital Sign    On average, how many days per week do you engage in moderate to strenuous exercise (like a brisk walk)?: 0 days    On average, how many minutes do you engage in exercise at this level?: 0 min  Stress: Stress Concern Present (02/13/2022)   Received from [facility] of Occupational [facility] - Occupational Stress Questionnaire    Feeling of Stress : To some extent  Social Connections: Socially Isolated (02/13/2022)   Received from [facility]   Social Connection and Isolation Panel    In a typical week, how many times do you talk on the phone with family, friends, or neighbors?: More than three times a week    How often do you get together with friends or relatives?: More than three times a week    How often do you attend church or religious services?: Never    Do you belong to any clubs or organizations such as church groups, unions, fraternal or athletic groups, or school groups?: No    How often do you attend meetings of the clubs or organizations you belong to?: Never    Are you married, widowed, divorced, separated, never married, or living with a partner?: Divorced  Depression (PHQ2-9): Not on file  Alcohol Screen: Not on file  Housing: Not on file  Utilities: Low Risk (11/19/2023)   Received from [facility]   Utilities    Within the past 12 months, have you been unable to get utilities(heat, electricity) when it was really needed?: No  Health Literacy: Low Risk (01/28/2024)   Received from [facility] Literacy    How often do you need to have someone help you when you read instructions, pamphlets, or other written material from your doctor or pharmacy?: Never    Review of Systems   Gen: Denies fever, chills, anorexia. Denies fatigue, weakness, weight loss.  CV: Denies chest pain, palpitations, syncope, peripheral edema, and claudication. Resp: Denies dyspnea at rest, cough, wheezing, coughing up blood, and pleurisy. + sinusitis/runny nose GI: See HPI Psych: + anxiety/depression. Denies memory loss, confusion. No homicidal or suicidal ideation.  Heme: Denies bruising, bleeding, and enlarged lymph nodes.  Physical Exam   BP 115/79 (BP Location: Right Arm, Patient Position: Sitting, Cuff Size: Large)   Pulse 72   Temp 97.6 F (36.4 C) (Temporal)   Ht 5' 5 (1.651 m)   Wt 211 lb (95.7 kg)   LMP 10/01/2023 (Approximate)   BMI 35.11 kg/m  General:   Alert and oriented. No distress noted. Pleasant and cooperative.  Head:  Normocephalic and atraumatic. Eyes:  Conjuctiva clear without scleral icterus. Mouth:  Oral mucosa pink and moist. Good dentition. No lesions. Abdomen:  +BS, soft, non-tender and non-distended. No rebound or guarding. No HSM or masses noted. Rectal: deferred Msk:  Symmetrical without gross deformities. Normal posture. Extremities:  Without edema. Neurologic:  Alert and  oriented x4 Psych:  Alert and cooperative. Normal mood and affect.  Assessment & Plan  RUDINE RIEGER is a 48 y.o. female presenting today for follow up on hemorrhoids.     Hemorrhoids, rectal pain Chronic hemorrhoids with pain, itching, and occasional bleeding. Previous treatments include nitroglycerin  and hydrocortisone  with limited relief. Considering Floxen (hesperidin  diazomine) for pain, itching, and swelling, but insurance coverage is uncertain. Discussed potential surgical options including laser ablation and hemorrhoid artery ligation, with associated risks such as bleeding, infection, and potential complications from blood supply disruption. She prefers to explore laser ablation further before considering other options. - Continue nitroglycerin , hydrocortisone , and lidocaine  separately for pain, itching, and swelling. - Will research availability of laser ablation for hemorrhoids within a reasonable distance. - Will send MyChart message with information on Flogen and availability of providers that do infrared coagulation or sclerotherapy.   IBS-C and OIC IBS-C and opioid induced constipation managed with Linzess , Motegrity , and stool softeners. Current regimen includes Motegrity  daily, Linzess  daily, and stool softeners daily with Dulcolax every 3rd day. Reports improvement with current regimen. - Continue current regimen of Linzess , Motegrity , and stool softeners.  - Continue dulcolax every 3rd day.      Follow up   Follow up 3 months, sooner if needed.    Charmaine Melia, MSN, FNP-BC, AGACNP-BC [facility]

## 2024-04-07 ENCOUNTER — Ambulatory Visit: Admitting: Gastroenterology

## 2024-04-07 ENCOUNTER — Encounter: Payer: Self-pay | Admitting: Gastroenterology

## 2024-04-07 VITALS — BP 115/79 | HR 72 | Temp 97.6°F | Ht 65.0 in | Wt 211.0 lb

## 2024-04-07 DIAGNOSIS — K6289 Other specified diseases of anus and rectum: Secondary | ICD-10-CM

## 2024-04-07 DIAGNOSIS — K5903 Drug induced constipation: Secondary | ICD-10-CM

## 2024-04-07 DIAGNOSIS — K649 Unspecified hemorrhoids: Secondary | ICD-10-CM

## 2024-04-07 DIAGNOSIS — K581 Irritable bowel syndrome with constipation: Secondary | ICD-10-CM

## 2024-04-07 MED ORDER — NITROGLYCERIN 2 % TD OINT: 0.5000 [in_us] | TOPICAL_OINTMENT | Freq: Four times a day (QID) | TRANSDERMAL | 3 refills | Status: AC

## 2024-04-07 MED ORDER — HYDROCORTISONE (PERIANAL) 2.5 % EX CREA: 1.0000 | TOPICAL_CREAM | Freq: Two times a day (BID) | CUTANEOUS | 1 refills | Status: AC

## 2024-04-07 MED ORDER — LIDOCAINE (ANORECTAL) 5 % EX GEL: CUTANEOUS | 2 refills | Status: AC

## 2024-04-07 NOTE — Patient Instructions (Addendum)
 Continue your current medication regimen for constipation. Ensure you are getting an adequate amount of water , at least 64-80 ounces of water  daily to help keep her stool as soft as possible Continue Motegrity  Continue Linzess  Continue stool softener 300 mg daily Continue bisacodyl every third day as needed  For your hemorrhoids: I will look into Flogen as an option for pain relief for your hemorrhoids.  I will also look into the availability of the sclerotherapy and infrared coagulation (laser therapy)  I will provide you some basic information about sclerotherapy and infrared coagulation for you to review.  Will look into seeing who within the state or a reasonable travel distance performs these measures.  Per my review and reading sclerotherapy is less effective than rubber band ligation of internal hemorrhoids.  Follow up in 3 months, sooner if needed.   It was a pleasure to see you today. I want to create trusting relationships with patients. If you receive a survey regarding your visit,  I greatly appreciate you taking time to fill this out on paper or through your MyChart. I value your feedback.  Charmaine Melia, MSN, FNP-BC, AGACNP-BC [facility]

## 2024-04-13 ENCOUNTER — Other Ambulatory Visit: Payer: Self-pay | Admitting: Gastroenterology

## 2024-04-13 DIAGNOSIS — K219 Gastro-esophageal reflux disease without esophagitis: Secondary | ICD-10-CM

## 2024-04-13 DIAGNOSIS — R110 Nausea: Secondary | ICD-10-CM

## 2024-05-09 ENCOUNTER — Other Ambulatory Visit: Payer: Self-pay | Admitting: Gastroenterology

## 2024-05-09 DIAGNOSIS — R110 Nausea: Secondary | ICD-10-CM

## 2024-06-25 ENCOUNTER — Telehealth: Admitting: Gastroenterology
